# Patient Record
Sex: Female | Born: 1960 | Race: White | Hispanic: No | Marital: Single | Smoking: Never smoker
Health system: Southern US, Community
[De-identification: ages and names within clinical notes are randomized; demographics above are authoritative.]

## PROBLEM LIST (undated history)

## (undated) DIAGNOSIS — F419 Anxiety disorder, unspecified: Secondary | ICD-10-CM

## (undated) DIAGNOSIS — F32A Depression, unspecified: Secondary | ICD-10-CM

## (undated) DIAGNOSIS — G43909 Migraine, unspecified, not intractable, without status migrainosus: Secondary | ICD-10-CM

## (undated) DIAGNOSIS — F329 Major depressive disorder, single episode, unspecified: Secondary | ICD-10-CM

## (undated) HISTORY — DX: Migraine, unspecified, not intractable, without status migrainosus: G43.909

## (undated) HISTORY — DX: Anxiety disorder, unspecified: F41.9

---

## 1983-07-22 HISTORY — PX: OTHER SURGICAL HISTORY: SHX169

## 1998-01-20 ENCOUNTER — Inpatient Hospital Stay (HOSPITAL_COMMUNITY): Admission: AD | Admit: 1998-01-20 | Discharge: 1998-01-20 | Payer: Self-pay | Admitting: *Deleted

## 1998-01-23 ENCOUNTER — Ambulatory Visit (HOSPITAL_COMMUNITY): Admission: RE | Admit: 1998-01-23 | Discharge: 1998-01-23 | Payer: Self-pay | Admitting: *Deleted

## 1998-01-25 ENCOUNTER — Ambulatory Visit (HOSPITAL_COMMUNITY): Admission: RE | Admit: 1998-01-25 | Discharge: 1998-01-25 | Payer: Self-pay | Admitting: *Deleted

## 2008-05-26 ENCOUNTER — Ambulatory Visit: Payer: Self-pay | Admitting: Internal Medicine

## 2008-06-20 ENCOUNTER — Ambulatory Visit: Payer: Self-pay | Admitting: Internal Medicine

## 2009-03-10 ENCOUNTER — Emergency Department: Payer: Self-pay | Admitting: Unknown Physician Specialty

## 2009-03-12 ENCOUNTER — Emergency Department: Payer: Self-pay | Admitting: Emergency Medicine

## 2010-12-11 ENCOUNTER — Ambulatory Visit: Payer: Self-pay

## 2012-01-06 ENCOUNTER — Ambulatory Visit: Payer: Self-pay

## 2013-02-09 ENCOUNTER — Ambulatory Visit: Payer: Self-pay

## 2013-02-24 ENCOUNTER — Ambulatory Visit: Payer: Self-pay | Admitting: Internal Medicine

## 2013-03-08 ENCOUNTER — Ambulatory Visit: Payer: Self-pay

## 2014-12-21 IMAGING — CR DG LUMBAR SPINE COMPLETE 4+V
1 series · 5 of 5 positions shown · non-contrast
Comparison: none

REASON FOR EXAM: low back pain
COMMENTS:

PROCEDURE:     KDR - KDXR LUMBAR SPINE WITH OBLIQUES  - February 09, 2013  [DATE]
RESULT:     Comparison: None

[Series 1: ap · 0.17mm/px · 5 of 5 slices shown]
[im 1/5]
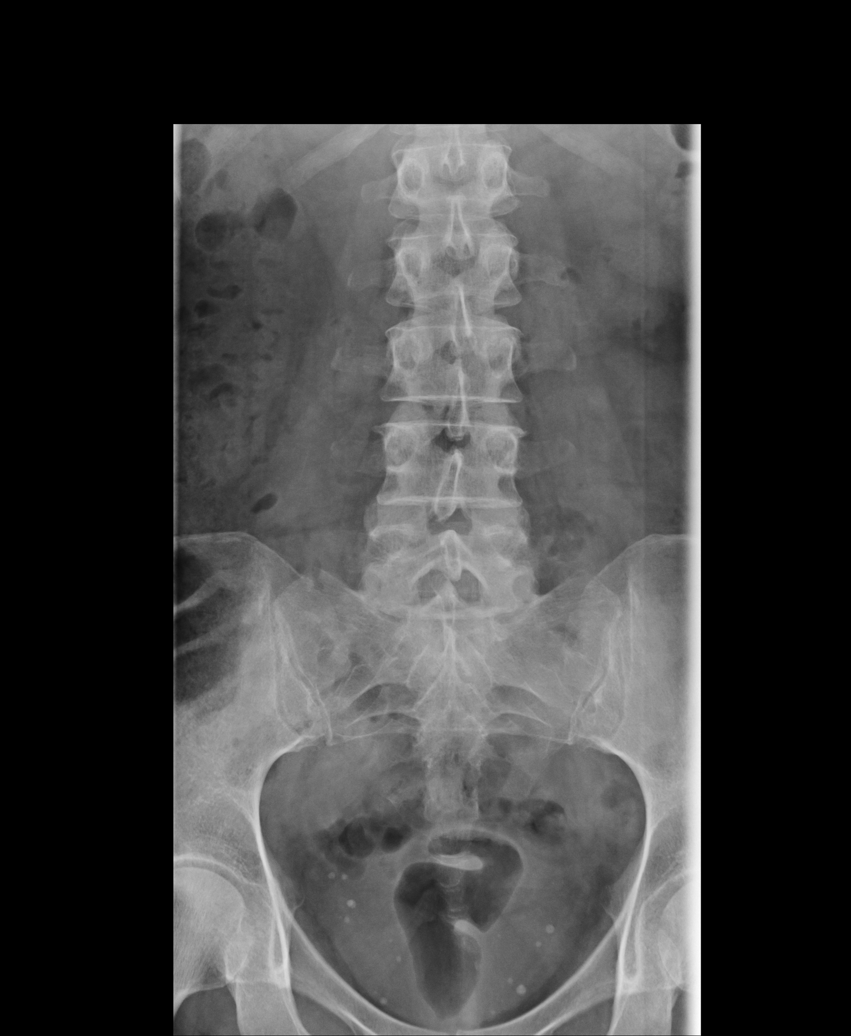
[im 2/5]
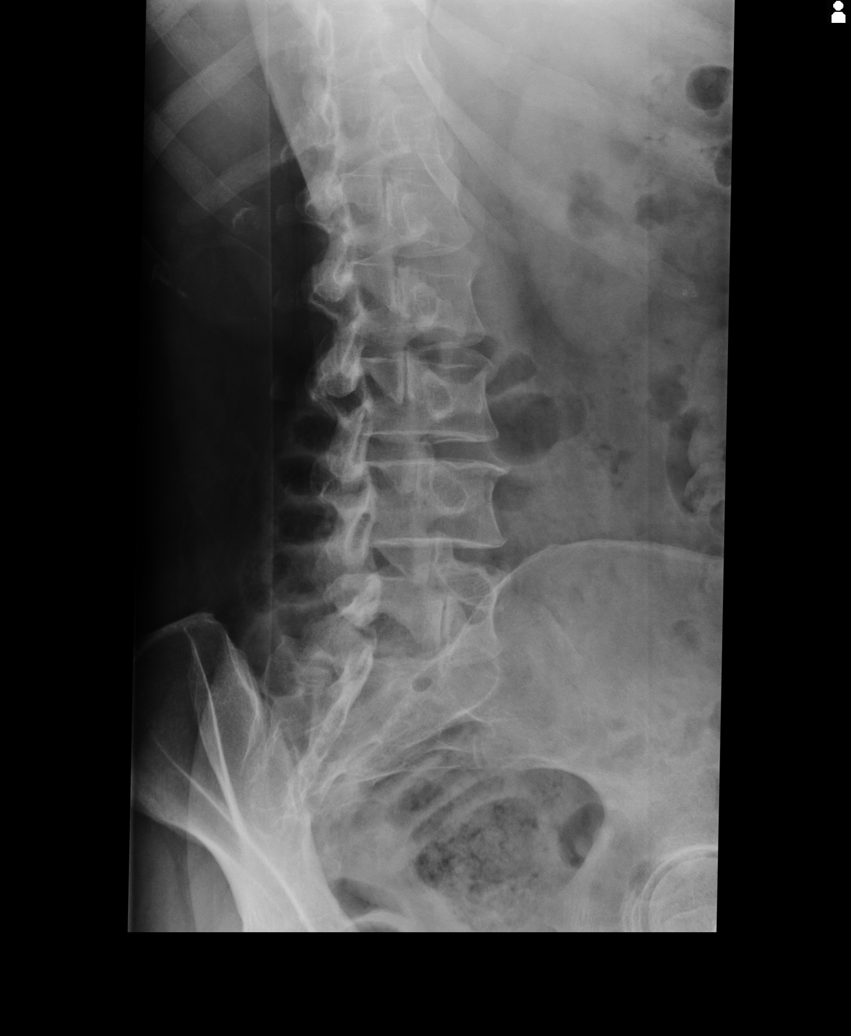
[im 3/5]
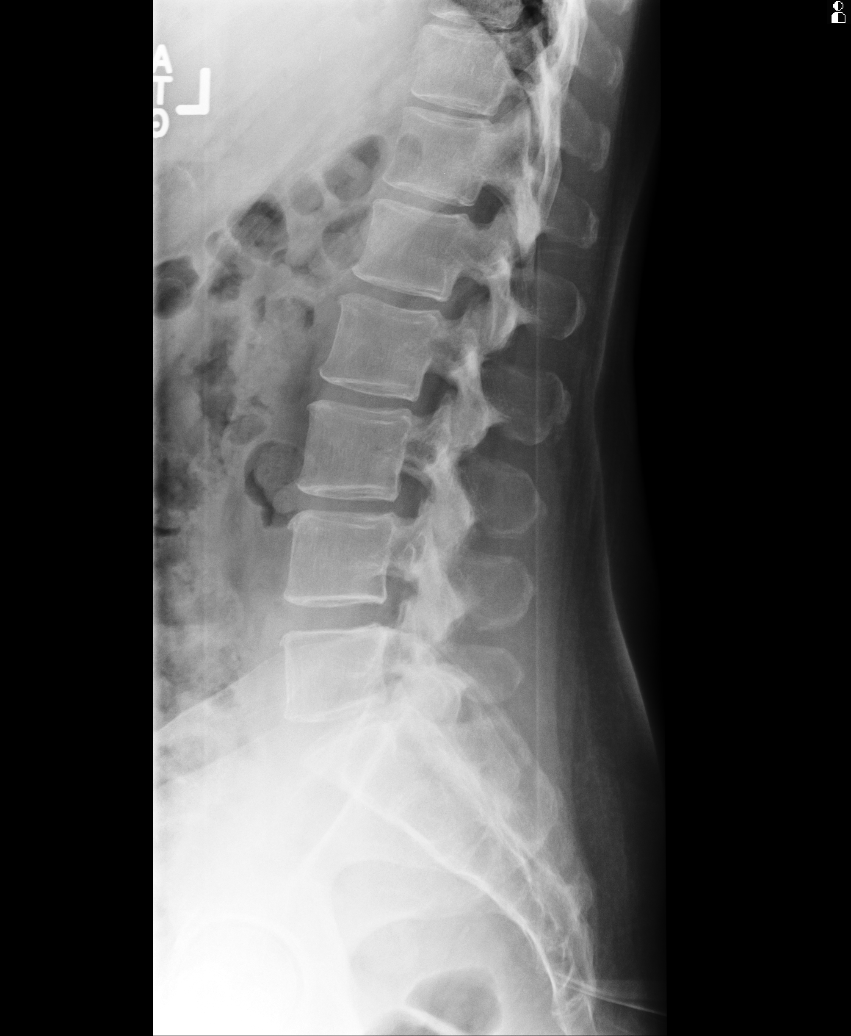
[im 4/5]
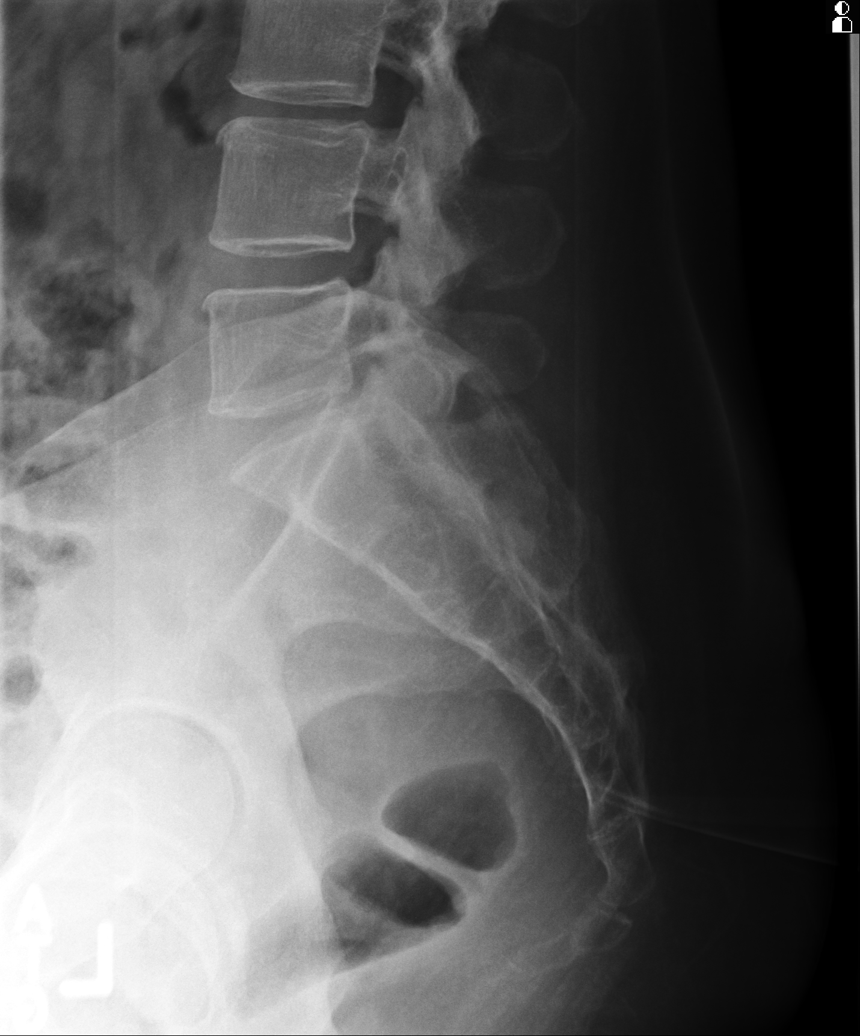
[im 5/5]
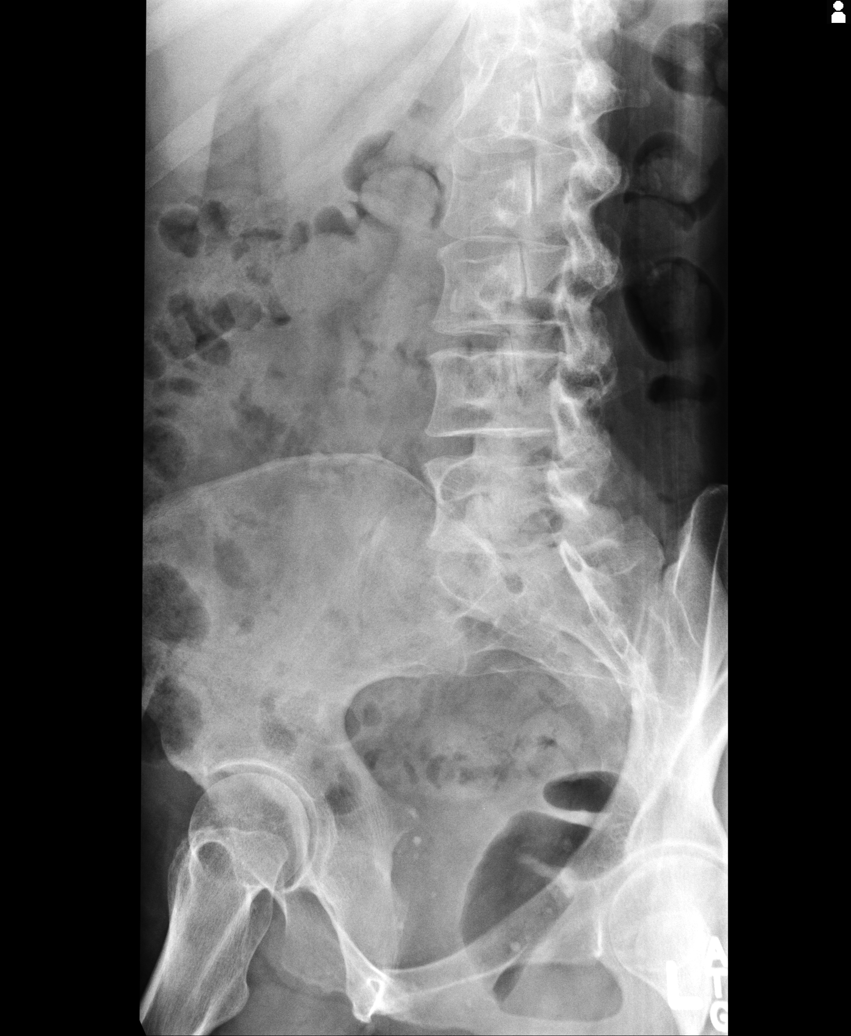

[5 of 5 positions shown; findings below may reference images not displayed]

FINDINGS: AP, lateral, and bilateral obliques views of the lumbar spine and a coned
down view of the lumbosacral junction are provided.

There are 5 nonrib bearing lumbar-type vertebral bodies. The vertebral body
heights are maintained. The alignment is anatomic. There is no
spondylolysis. There is no acute fracture or static listhesis. The disc
spaces are maintained.

The SI joints are unremarkable.
IMPRESSION: 1. No acute osseous abnormality of the lumbar spine.

[REDACTED]

## 2015-11-24 ENCOUNTER — Emergency Department
Admission: EM | Admit: 2015-11-24 | Discharge: 2015-11-24 | Disposition: A | Discharge status: Home / Self Care | Payer: 59 | Attending: Student | Admitting: Student

## 2015-11-24 ENCOUNTER — Emergency Department: Payer: 59

## 2015-11-24 DIAGNOSIS — M549 Dorsalgia, unspecified: Secondary | ICD-10-CM | POA: Diagnosis present

## 2015-11-24 DIAGNOSIS — F329 Major depressive disorder, single episode, unspecified: Secondary | ICD-10-CM | POA: Diagnosis not present

## 2015-11-24 DIAGNOSIS — M5431 Sciatica, right side: Secondary | ICD-10-CM | POA: Insufficient documentation

## 2015-11-24 HISTORY — DX: Major depressive disorder, single episode, unspecified: F32.9

## 2015-11-24 HISTORY — DX: Depression, unspecified: F32.A

## 2015-11-24 LAB — CBC WITH DIFFERENTIAL/PLATELET
Basophils Absolute: 0 10*3/uL (ref 0–0.1)
Basophils Relative: 0 %
EOS ABS: 0 10*3/uL (ref 0–0.7)
Eosinophils Relative: 0 %
HCT: 40.4 % (ref 35.0–47.0)
HEMOGLOBIN: 13.5 g/dL (ref 12.0–16.0)
LYMPHS ABS: 2.3 10*3/uL (ref 1.0–3.6)
MCH: 30.7 pg (ref 26.0–34.0)
MCHC: 33.3 g/dL (ref 32.0–36.0)
MCV: 92 fL (ref 80.0–100.0)
Monocytes Absolute: 0.9 10*3/uL (ref 0.2–0.9)
Neutro Abs: 10.9 10*3/uL — ABNORMAL HIGH (ref 1.4–6.5)
Platelets: 275 10*3/uL (ref 150–440)
RBC: 4.39 MIL/uL (ref 3.80–5.20)
RDW: 13.4 % (ref 11.5–14.5)
WBC: 14.2 10*3/uL — AB (ref 3.6–11.0)

## 2015-11-24 LAB — URINALYSIS COMPLETE WITH MICROSCOPIC (ARMC ONLY)
BACTERIA UA: NONE SEEN
BILIRUBIN URINE: NEGATIVE
GLUCOSE, UA: NEGATIVE mg/dL
Hgb urine dipstick: NEGATIVE
LEUKOCYTES UA: NEGATIVE
NITRITE: NEGATIVE
PROTEIN: 30 mg/dL — AB
SPECIFIC GRAVITY, URINE: 1.031 — AB (ref 1.005–1.030)
pH: 5 (ref 5.0–8.0)

## 2015-11-24 LAB — COMPREHENSIVE METABOLIC PANEL
ALK PHOS: 55 U/L (ref 38–126)
ALT: 21 U/L (ref 14–54)
ANION GAP: 10 (ref 5–15)
AST: 22 U/L (ref 15–41)
Albumin: 4.7 g/dL (ref 3.5–5.0)
BILIRUBIN TOTAL: 0.8 mg/dL (ref 0.3–1.2)
BUN: 30 mg/dL — ABNORMAL HIGH (ref 6–20)
CALCIUM: 9.6 mg/dL (ref 8.9–10.3)
CO2: 24 mmol/L (ref 22–32)
CREATININE: 0.78 mg/dL (ref 0.44–1.00)
Chloride: 106 mmol/L (ref 101–111)
Glucose, Bld: 98 mg/dL (ref 65–99)
Potassium: 3.4 mmol/L — ABNORMAL LOW (ref 3.5–5.1)
SODIUM: 140 mmol/L (ref 135–145)
TOTAL PROTEIN: 7.5 g/dL (ref 6.5–8.1)

## 2015-11-24 MED ORDER — MORPHINE SULFATE (PF) 4 MG/ML IV SOLN
4.0000 mg | Freq: Once | INTRAVENOUS | Status: AC
  Administered 2015-11-24: 4 mg via INTRAVENOUS
  Filled 2015-11-24: qty 1

## 2015-11-24 MED ORDER — KETOROLAC TROMETHAMINE 30 MG/ML IJ SOLN
15.0000 mg | Freq: Once | INTRAMUSCULAR | Status: AC
  Administered 2015-11-24: 15 mg via INTRAVENOUS
  Filled 2015-11-24: qty 1

## 2015-11-24 MED ORDER — OXYCODONE HCL 5 MG PO TABS: 5.0000 mg | ORAL_TABLET | Freq: Four times a day (QID) | ORAL | Status: DC | PRN

## 2015-11-24 MED ORDER — DIAZEPAM 5 MG/ML IJ SOLN
2.5000 mg | Freq: Once | INTRAMUSCULAR | Status: AC
Start: 2015-11-24 — End: 2015-11-24
  Administered 2015-11-24: 2.5 mg via INTRAVENOUS
  Filled 2015-11-24: qty 2

## 2015-11-24 MED ORDER — OXYCODONE HCL 5 MG PO TABS
10.0000 mg | ORAL_TABLET | Freq: Once | ORAL | Status: AC
Start: 2015-11-24 — End: 2015-11-24
  Administered 2015-11-24: 10 mg via ORAL
  Filled 2015-11-24: qty 2

## 2015-11-24 MED ORDER — SODIUM CHLORIDE 0.9 % IV BOLUS (SEPSIS)
500.0000 mL | Freq: Once | INTRAVENOUS | Status: AC
  Administered 2015-11-24: 500 mL via INTRAVENOUS

## 2015-11-24 NOTE — ED Notes (Signed)
Pt presents via EMS c/o right sides back pain into right leg x1 week. Dx by PCP with sciatica and started on prednisone and "muscle relaxer" per report. No resolution of symptoms with acute worsening. 50 mcg fentanyl given by EMS in route.

## 2015-11-24 NOTE — ED Provider Notes (Signed)
[facility] Emergency Department Provider Note   ____________________________________________  Time seen: Approximately 10:01 AM  I have reviewed the triage vital signs and the nursing notes.   HISTORY  Chief Complaint Back Pain    HPI Melinda Palmer is a 55 y.o. female history of depression, history of "bulging disks in my back" who presents for evaluation of one week of worsening right pain radiating into the right posterior thigh, gradual onset, constant since onset, worse with movements, currently severe. Patient was seen by her primary care doctor 2 days ago, diagnosed with sciatica and started on a muscle relaxer as well as tramadol however, her symptoms have persisted and worsened today. No history of malignancy, no history of IV drug use, no fevers, no bowel or bladder incontinence or saddle anesthesia, no numbness or weakness in the legs. No chest pain, shortness of breath or abdominal pain, no pain or burning with urination. She has never had pain like this before. No recent falls or trauma.   Past Medical History  Diagnosis Date  . Depression     There are no active problems to display for this patient.   No past surgical history on file.  No current outpatient prescriptions on file.  Allergies Penicillins  History reviewed. No pertinent family history.  Social History Social History  Substance Use Topics  . Smoking status: Never Smoker   . Smokeless tobacco: None  . Alcohol Use: No    Review of Systems Constitutional: No fever/chills Eyes: No visual changes. ENT: No sore throat. Cardiovascular: Denies chest pain. Respiratory: Denies shortness of breath. Gastrointestinal: No abdominal pain.  No nausea, no vomiting.  No diarrhea.  No constipation. Genitourinary: Negative for dysuria. Musculoskeletal: Positive for back pain. Skin: Negative for rash. Neurological: Negative for headaches, focal weakness or numbness.  10-point  ROS otherwise negative.  ____________________________________________   PHYSICAL EXAM:  VITAL SIGNS: ED Triage Vitals  Enc Vitals Group     BP 11/24/15 0959 133/88 mmHg     Pulse Rate 11/24/15 0959 88     Resp 11/24/15 0959 14     Temp 11/24/15 0959 98.1 F (36.7 C)     Temp Source 11/24/15 0959 Oral     SpO2 11/24/15 0959 99 %     Weight 11/24/15 0959 164 lb (74.39 kg)     Height 11/24/15 0959 5\' 5"  (1.651 m)     Head Cir --      Peak Flow --      Pain Score 11/24/15 1000 8     Pain Loc --      Pain Edu? --      Excl. in [place]? --     Constitutional: Alert and oriented. In distress secondary to pain. Eyes: Conjunctivae are normal. PERRL. EOMI. Head: Atraumatic. Nose: No congestion/rhinnorhea. Mouth/Throat: Mucous membranes are moist.  Oropharynx non-erythematous. Neck: No stridor. Supple without meningismus. Cardiovascular: Normal rate, regular rhythm. Grossly normal heart sounds.  Good peripheral circulation. Respiratory: Normal respiratory effort.  No retractions. Lungs CTAB. Gastrointestinal: Soft and nontender. No distention. No CVA tenderness. Genitourinary: Deferred Musculoskeletal: No lower extremity tenderness nor edema.  No joint effusions. Minimal Tenderness to palpation throughout the right lumbar paraspinal muscles. No midline T or L-spine tenderness to palpation. Positive straight leg raise in the right leg at 45. Neurologic:  Normal speech and language. No gross focal neurologic deficits are appreciated. Normal strength of dorsiflexion of the big toes bilaterally. Skin:  Skin is warm, dry and intact.  No rash noted. Psychiatric: Mood and affect are normal. Speech and behavior are normal.  ____________________________________________   LABS (all labs ordered are listed, but only abnormal results are displayed)  Labs Reviewed  CBC WITH DIFFERENTIAL/PLATELET - Abnormal; Notable for the following:    WBC 14.2 (*)    Neutro Abs 10.9 (*)    All other  components within normal limits  COMPREHENSIVE METABOLIC PANEL - Abnormal; Notable for the following:    Potassium 3.4 (*)    BUN 30 (*)    All other components within normal limits  URINALYSIS COMPLETEWITH MICROSCOPIC ([facility] ONLY) - Abnormal; Notable for the following:    Color, Urine AMBER (*)    APPearance HAZY (*)    Ketones, ur TRACE (*)    Specific Gravity, Urine 1.031 (*)    Protein, ur 30 (*)    Squamous Epithelial / LPF 6-30 (*)    All other components within normal limits  URINE CULTURE   ____________________________________________  EKG  none ____________________________________________  RADIOLOGY  Lumbar xray  IMPRESSION: 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 Other disc spaces appear unremarkable. No fracture or spondylolisthesis.  ____________________________________________   PROCEDURES  Procedure(s) performed: None  Critical Care performed: No  ____________________________________________   INITIAL IMPRESSION / ASSESSMENT AND PLAN / ED COURSE  Pertinent labs & imaging results that were available during my care of the patient were reviewed by me and considered in my medical decision making (see chart for details).  Melinda Palmer is a 55 y.o. female history of depression, history of "bulging disks in my back" who presents for evaluation of one week of worsening right pain radiating into the right posterior thigh. On exam, she is in severe distress due to pain. Vital signs are stable, she is afebrile. She has an intact neurological examination, no saddle anesthesia, no red flags concerning for cauda equina or epidural abscess. Suspect that this may represent ongoing sciatica, we'll treat her symptomatically and reassess for disposition.  ----------------------------------------- 10:55 AM on 11/24/2015 ----------------------------------------- Patient still intermittent crying out in pain, we'll give morphine. We'll obtain  screening labs, number plain films, as well as urinalysis to evaluate for other etiologies which could cause her pain.  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 CBC is notable for leukocytosis, white blood cell count is elevated at 14,000 however this is likely catecholamine/stress-induced in the setting of pain. CMP with mild nonspecific BUN elevation. Urinalysis with minimal red blood cells, doubt kidney stone. There are a few white blood cells however there are also multiple squamous cells and I suspect this is a contaminated specimen, will send urine culture. There are no bacteria. Patient reports that she feels much better. She is lying comfortably in bed, she is ambulating without any assistance. We discussed return precautions, need for close PCP follow-up and she is comfortable with the discharge plan. DC home.  ____________________________________________   FINAL CLINICAL IMPRESSION(S) / ED DIAGNOSES  Final diagnoses:  Sciatica of right side      NEW MEDICATIONS STARTED DURING THIS VISIT:  New Prescriptions   No medications on file     Note:  This document was prepared using Dragon voice recognition software and may include unintentional dictation errors.    Joanne Gavel, MD 11/24/15 1324

## 2015-11-24 NOTE — Discharge Instructions (Signed)
Stop taking tramadol. Start taking Roxicodone as prescribed for pain.

## 2015-11-26 LAB — URINE CULTURE

## 2016-08-04 DIAGNOSIS — K219 Gastro-esophageal reflux disease without esophagitis: Secondary | ICD-10-CM | POA: Diagnosis not present

## 2016-10-28 DIAGNOSIS — N390 Urinary tract infection, site not specified: Secondary | ICD-10-CM | POA: Diagnosis not present

## 2017-02-28 DIAGNOSIS — N390 Urinary tract infection, site not specified: Secondary | ICD-10-CM | POA: Diagnosis not present

## 2017-05-19 DIAGNOSIS — Z0001 Encounter for general adult medical examination with abnormal findings: Secondary | ICD-10-CM | POA: Diagnosis not present

## 2017-06-16 DIAGNOSIS — Z1231 Encounter for screening mammogram for malignant neoplasm of breast: Secondary | ICD-10-CM | POA: Diagnosis not present

## 2017-10-04 IMAGING — CR DG LUMBAR SPINE 2-3V
3 series · 3 of 3 positions shown · non-contrast
Comparison: Lumbar spine radiographs February 09, 2013; lumbar MRI
February 24, 2013

CLINICAL DATA: Lumbago with right-sided radicular symptoms for 1
week

EXAM:
LUMBAR SPINE - 2-3 VIEW

[l-spine ap]
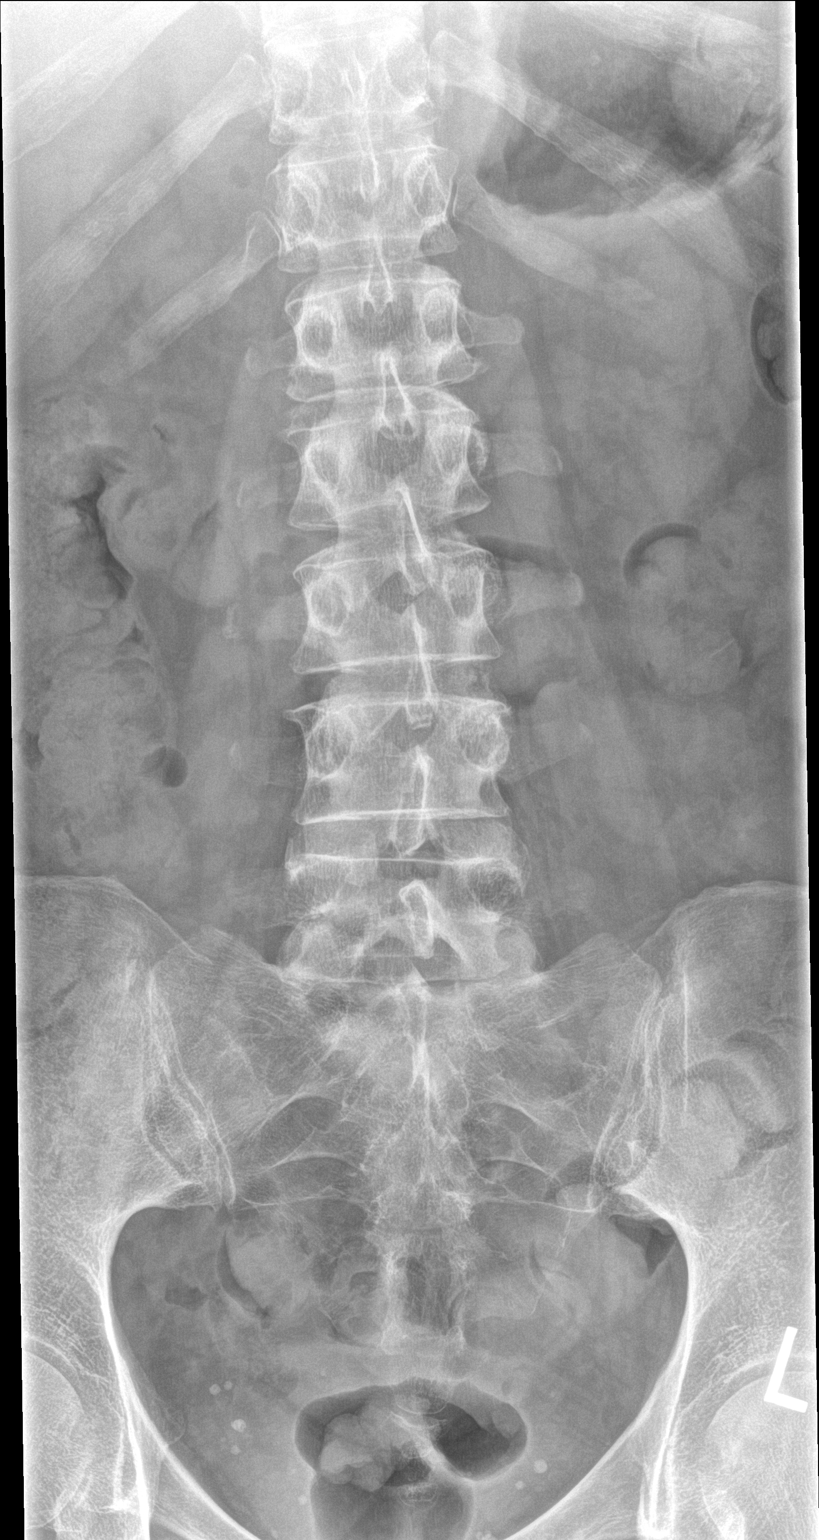

[l-spine lat]
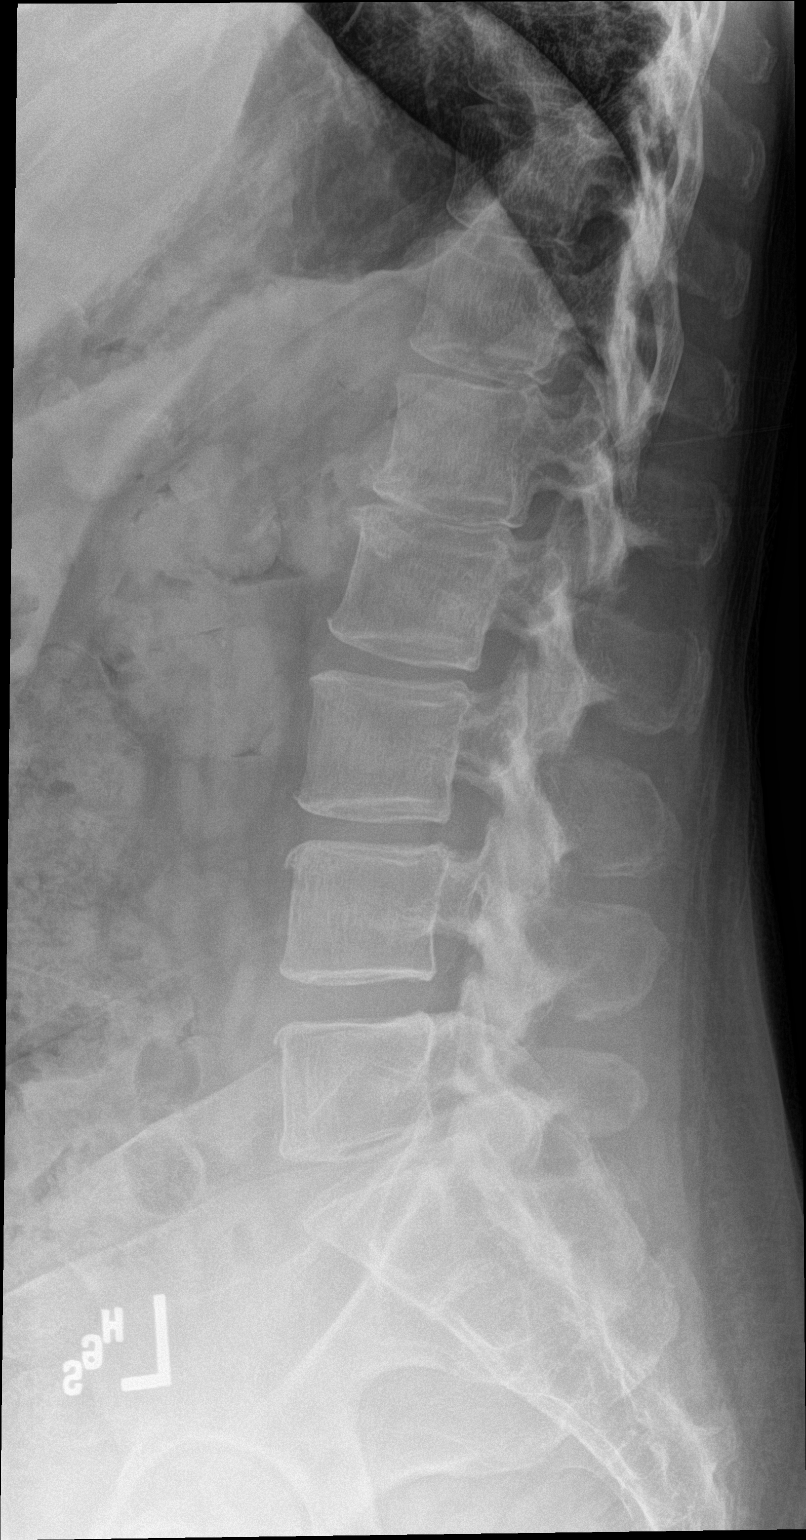

[l-spine spot]
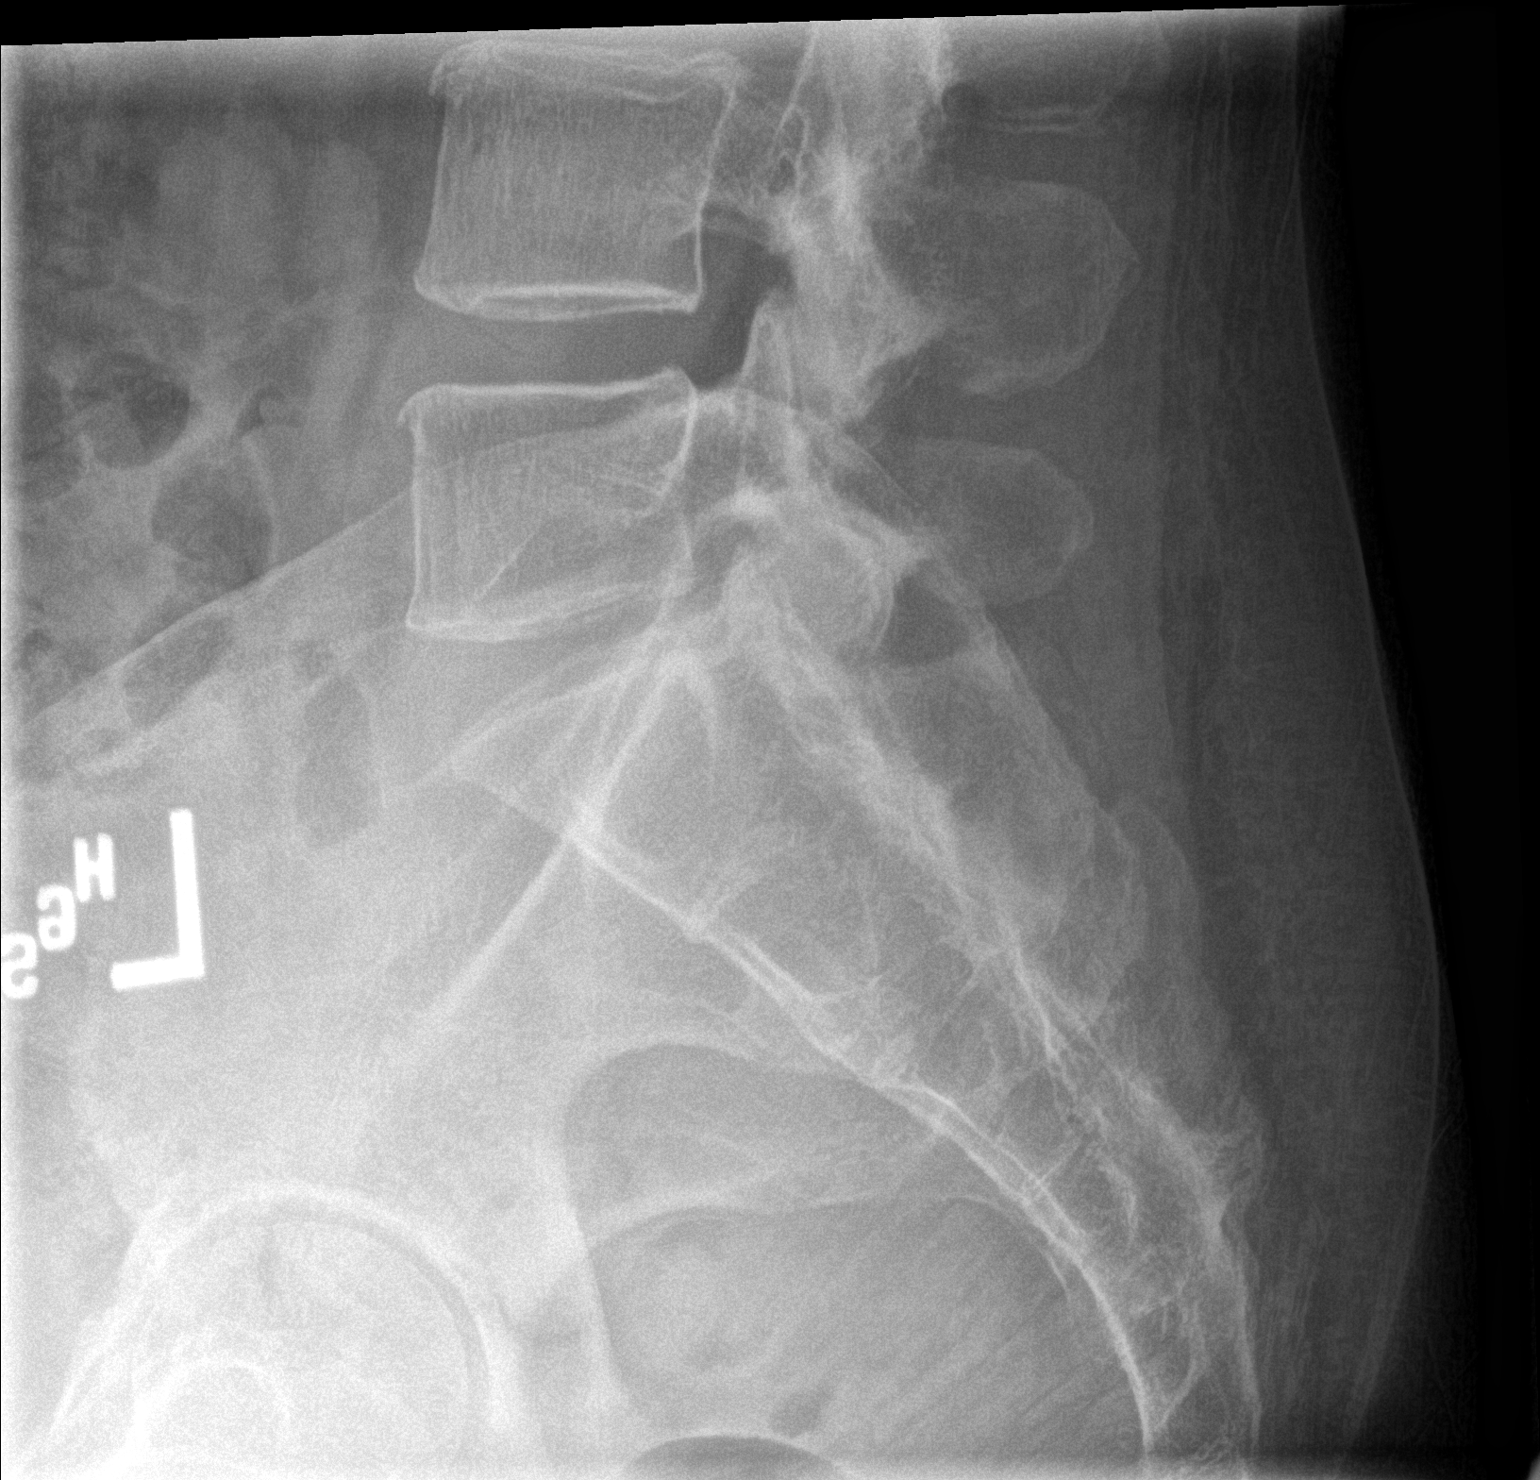

[3 of 3 positions shown; findings below may reference images not displayed]

FINDINGS: Frontal, lateral, and spot lumbosacral lateral images were obtained.
There are 5 non-rib-bearing lumbar type vertebral bodies. There is
no fracture or spondylolisthesis. There is moderate disc space
narrowing at L1-2. Other disc spaces appear unremarkable. No erosive
change.
IMPRESSION: Osteoarthritic change at L1-2, progressed from prior studies. Other
disc spaces appear unremarkable. No fracture or spondylolisthesis.

## 2017-10-20 DIAGNOSIS — N390 Urinary tract infection, site not specified: Secondary | ICD-10-CM | POA: Diagnosis not present

## 2017-12-07 ENCOUNTER — Other Ambulatory Visit: Payer: Self-pay

## 2017-12-07 MED ORDER — SERTRALINE HCL 50 MG PO TABS: 50.0000 mg | ORAL_TABLET | Freq: Every day | ORAL | 1 refills | Status: DC

## 2018-04-22 ENCOUNTER — Telehealth: Payer: Self-pay

## 2018-04-22 ENCOUNTER — Other Ambulatory Visit: Payer: Self-pay

## 2018-04-22 NOTE — Telephone Encounter (Signed)
lmom we send med for 90 with no refills keep appt coming up next month

## 2018-04-23 ENCOUNTER — Other Ambulatory Visit: Payer: Self-pay

## 2018-04-23 MED ORDER — SERTRALINE HCL 50 MG PO TABS: 50.0000 mg | ORAL_TABLET | Freq: Every day | ORAL | 0 refills | Status: DC

## 2018-05-24 ENCOUNTER — Encounter: Payer: Self-pay | Admitting: Adult Health

## 2018-06-21 ENCOUNTER — Other Ambulatory Visit: Payer: Self-pay

## 2018-06-21 MED ORDER — SERTRALINE HCL 50 MG PO TABS: 50.0000 mg | ORAL_TABLET | Freq: Every day | ORAL | 0 refills | Status: DC

## 2018-08-16 ENCOUNTER — Encounter: Payer: Self-pay | Admitting: Nurse Practitioner

## 2018-08-20 ENCOUNTER — Ambulatory Visit: Payer: Managed Care, Other (non HMO) | Admitting: Nurse Practitioner

## 2018-08-20 ENCOUNTER — Encounter: Payer: Self-pay | Admitting: Nurse Practitioner

## 2018-08-20 VITALS — BP 114/69 | HR 77 | Resp 16 | Ht 65.0 in | Wt 170.8 lb

## 2018-08-20 DIAGNOSIS — F329 Major depressive disorder, single episode, unspecified: Secondary | ICD-10-CM

## 2018-08-20 DIAGNOSIS — R5383 Other fatigue: Secondary | ICD-10-CM | POA: Diagnosis not present

## 2018-08-20 DIAGNOSIS — Z1239 Encounter for other screening for malignant neoplasm of breast: Secondary | ICD-10-CM

## 2018-08-20 DIAGNOSIS — R300 Dysuria: Secondary | ICD-10-CM

## 2018-08-20 DIAGNOSIS — F411 Generalized anxiety disorder: Secondary | ICD-10-CM | POA: Diagnosis not present

## 2018-08-20 DIAGNOSIS — Z0001 Encounter for general adult medical examination with abnormal findings: Secondary | ICD-10-CM | POA: Diagnosis not present

## 2018-08-20 DIAGNOSIS — E559 Vitamin D deficiency, unspecified: Secondary | ICD-10-CM

## 2018-08-20 DIAGNOSIS — Z1211 Encounter for screening for malignant neoplasm of colon: Secondary | ICD-10-CM

## 2018-08-20 MED ORDER — ALPRAZOLAM 0.5 MG PO TABS: 0.5000 mg | ORAL_TABLET | Freq: Every evening | ORAL | 3 refills | Status: DC | PRN

## 2018-08-20 MED ORDER — BUPROPION HCL 75 MG PO TABS: 75.0000 mg | ORAL_TABLET | Freq: Two times a day (BID) | ORAL | 3 refills | Status: DC

## 2018-08-20 NOTE — Progress Notes (Signed)
[facility] [place], [place] [address]  Internal MEDICINE  Office Visit Note  Patient Name: Melinda Palmer  [id number]  [id number]  Date of Service: 08/29/2018   Pt is here for routine health maintenance examination   Chief Complaint  Patient presents with  . Annual Exam     The patient is here for health maintenance exam. She has no physical concerns or complaints today. She admits to having some trouble with decreased libido over the past few months. She does take sertraline 50mg  daily to control anxiety and depression and uses alprazolam 0.5mg  when needed to improve acute anxiety and insomnia. She is unsure if the decreased libido has to to with medication she is currently taking or if it related to getting older.  She is due to have routine, fasting labs done as well as mammogram. She is also due to have screening colonoscopy.    Current Medication: Outpatient Encounter Medications as of 08/20/2018  Medication Sig  . ALPRAZolam (XANAX) 0.5 MG tablet Take 1 tablet (0.5 mg total) by mouth at bedtime as needed for anxiety.  Marland Kitchen oxyCODONE (ROXICODONE) 5 MG immediate release tablet Take 1 tablet (5 mg total) by mouth every 6 (six) hours as needed for moderate pain. Do not drive while taking this medication.  . sertraline (ZOLOFT) 50 MG tablet Take 1 tablet (50 mg total) by mouth daily.  . [DISCONTINUED] ALPRAZolam (XANAX) 0.5 MG tablet 1 tablet.  Marland Kitchen buPROPion (WELLBUTRIN) 75 MG tablet Take 1 tablet (75 mg total) by mouth 2 (two) times daily.   No facility-administered encounter medications on file as of 08/20/2018.     Surgical History: Past Surgical History:  Procedure Laterality Date  . child birth natural  50    Medical History: Past Medical History:  Diagnosis Date  . Anxiety   . Depression   . Migraines     Family History: Family History  Problem Relation Age of Onset  . Hypertension Neg Hx   . Diabetes Neg Hx   . Hyperlipidemia Neg Hx    . Lymphoma Neg Hx   . Migraines Neg Hx   . Glaucoma Neg Hx   . Stroke Neg Hx       Review of Systems  Constitutional: Negative for chills, fatigue and unexpected weight change.  HENT: Negative for congestion, postnasal drip, rhinorrhea, sneezing and sore throat.   Respiratory: Negative for cough, chest tightness and shortness of breath.   Cardiovascular: Negative for chest pain and palpitations.  Gastrointestinal: Negative for abdominal pain, constipation, diarrhea, nausea and vomiting.  Endocrine: Negative for heat intolerance, polydipsia and polyuria.  Genitourinary: Negative for dysuria and frequency.  Musculoskeletal: Negative for arthralgias, back pain, joint swelling and neck pain.  Skin: Negative for rash.  Allergic/Immunologic: Negative for environmental allergies.  Neurological: Negative for dizziness, tremors, numbness and headaches.  Hematological: Negative for adenopathy. Does not bruise/bleed easily.  Psychiatric/Behavioral: Negative for behavioral problems (Depression), sleep disturbance and suicidal ideas. The patient is nervous/anxious.      Vital Signs: BP 114/69 (BP Location: Right Arm, Patient Position: Sitting, Cuff Size: Large)   Pulse 77   Resp 16   Ht 5\' 5"  (1.651 m)   Wt 170 lb 12.8 oz (77.5 kg)   LMP  (LMP Unknown)   SpO2 95%   BMI 28.42 kg/m    Physical Exam Vitals signs and nursing note reviewed.  Constitutional:      General: She is not in acute distress.  Appearance: Normal appearance. She is well-developed. She is not diaphoretic.  HENT:     Head: Normocephalic and atraumatic.     Mouth/Throat:     Pharynx: No oropharyngeal exudate.  Eyes:     Conjunctiva/sclera: Conjunctivae normal.     Pupils: Pupils are equal, round, and reactive to light.  Neck:     Musculoskeletal: Normal range of motion and neck supple.     Thyroid: No thyromegaly.     Vascular: No carotid bruit or JVD.     Trachea: No tracheal deviation.  Cardiovascular:      Rate and Rhythm: Normal rate and regular rhythm.     Pulses: Normal pulses.     Heart sounds: Normal heart sounds. No murmur. No friction rub. No gallop.   Pulmonary:     Effort: Pulmonary effort is normal. No respiratory distress.     Breath sounds: Normal breath sounds. No wheezing or rales.  Chest:     Chest wall: No tenderness.     Breasts:        Right: Normal. No swelling, bleeding, inverted nipple, mass, nipple discharge, skin change or tenderness.        Left: Normal. No swelling, bleeding, inverted nipple, mass, nipple discharge, skin change or tenderness.  Abdominal:     General: Bowel sounds are normal.     Palpations: Abdomen is soft.  Musculoskeletal: Normal range of motion.  Lymphadenopathy:     Cervical: No cervical adenopathy.  Skin:    General: Skin is warm and dry.     Capillary Refill: Capillary refill takes less than 2 seconds.  Neurological:     Mental Status: She is alert and oriented to person, place, and time.     Cranial Nerves: No cranial nerve deficit.  Psychiatric:        Behavior: Behavior normal.        Thought Content: Thought content normal.        Judgment: Judgment normal.      LABS: Recent Results (from the past 2160 hour(s))  UA/M w/rflx Culture, Routine     Status: None   Collection Time: 08/20/18 12:00 AM  Result Value Ref Range   Specific Gravity, UA 1.021 1.005 - 1.030   pH, UA 6.0 5.0 - 7.5   Color, UA Yellow Yellow   Appearance Ur Clear Clear   Leukocytes, UA Negative Negative   Protein, UA Negative Negative/Trace   Glucose, UA Negative Negative   Ketones, UA Negative Negative   RBC, UA Negative Negative   Bilirubin, UA Negative Negative   Urobilinogen, Ur 1.0 0.2 - 1.0 mg/dL   Nitrite, UA Negative Negative   Microscopic Examination Comment     Comment: Microscopic follows if indicated.   Microscopic Examination See below:     Comment: Microscopic was indicated and was performed.   Urinalysis Reflex Comment      Comment: This specimen will not reflex to a Urine Culture.  Microscopic Examination     Status: None   Collection Time: 08/20/18 12:00 AM  Result Value Ref Range   WBC, UA 0-5 0 - 5 /hpf   RBC, UA 0-2 0 - 2 /hpf   Epithelial Cells (non renal) None seen 0 - 10 /hpf   Casts None seen None seen /lpf   Mucus, UA Present Not Estab.   Bacteria, UA None seen None seen/Few  Comprehensive metabolic panel     Status: None   Collection Time: 08/23/18  8:55 AM  Result Value  Ref Range   Glucose 93 65 - 99 mg/dL   BUN 16 6 - 24 mg/dL   Creatinine, Ser 0.74 0.57 - 1.00 mg/dL   GFR calc non Af Amer 90 >59 mL/min/1.73   GFR calc Af Amer 104 >59 mL/min/1.73   BUN/Creatinine Ratio 22 9 - 23   Sodium 140 134 - 144 mmol/L   Potassium 4.6 3.5 - 5.2 mmol/L   Chloride 103 96 - 106 mmol/L   CO2 20 20 - 29 mmol/L   Calcium 9.7 8.7 - 10.2 mg/dL   Total Protein 6.9 6.0 - 8.5 g/dL   Albumin 4.6 3.8 - 4.9 g/dL    Comment:               **Please note reference interval change**   Globulin, Total 2.3 1.5 - 4.5 g/dL   Albumin/Globulin Ratio 2.0 1.2 - 2.2   Bilirubin Total 0.2 0.0 - 1.2 mg/dL   Alkaline Phosphatase 66 39 - 117 IU/L   AST 14 0 - 40 IU/L   ALT 18 0 - 32 IU/L  CBC     Status: None   Collection Time: 08/23/18  8:55 AM  Result Value Ref Range   WBC 5.7 3.4 - 10.8 x10E3/uL   RBC 4.33 3.77 - 5.28 x10E6/uL   Hemoglobin 13.7 11.1 - 15.9 g/dL   Hematocrit 39.4 34.0 - 46.6 %   MCV 91 79 - 97 fL   MCH 31.6 26.6 - 33.0 pg   MCHC 34.8 31.5 - 35.7 g/dL   RDW 12.8 11.7 - 15.4 %   Platelets 277 150 - 450 x10E3/uL  Lipid Panel w/o Chol/HDL Ratio     Status: Abnormal   Collection Time: 08/23/18  8:55 AM  Result Value Ref Range   Cholesterol, Total 188 100 - 199 mg/dL   Triglycerides 56 0 - 149 mg/dL   HDL 68 >39 mg/dL   VLDL Cholesterol Cal 11 5 - 40 mg/dL   LDL Calculated 109 (H) 0 - 99 mg/dL  T4, free     Status: None   Collection Time: 08/23/18  8:55 AM  Result Value Ref Range   Free T4 1.18  0.82 - 1.77 ng/dL  TSH     Status: None   Collection Time: 08/23/18  8:55 AM  Result Value Ref Range   TSH 3.460 0.450 - 4.500 uIU/mL  VITAMIN D 25 Hydroxy (Vit-D Deficiency, Fractures)     Status: Abnormal   Collection Time: 08/23/18  8:55 AM  Result Value Ref Range   Vit D, 25-Hydroxy 28.4 (L) 30.0 - 100.0 ng/mL    Comment: Vitamin D deficiency has been defined by the Institute of Medicine and an Endocrine Society practice guideline as a level of serum 25-OH vitamin D less than 20 ng/mL (1,2). The Endocrine Society went on to further define vitamin D insufficiency as a level between 21 and 29 ng/mL (2). 1. IOM (Institute of Medicine). 2010. Dietary reference    intakes for calcium and D. [place]: The    [employer]. 2. Holick MF, Binkley [place], Bischoff-Ferrari HA, et al.    Evaluation, treatment, and prevention of vitamin D    deficiency: an Endocrine Society clinical practice    guideline. JCEM. 2011 Jul; 96(7):1911-30.   T3     Status: None   Collection Time: 08/23/18  8:55 AM  Result Value Ref Range   T3, Total 92 71 - 180 ng/dL   Assessment/Plan:  1. Encounter for general adult  medical examination with abnormal findings Annual health maintenance exam today. Routine, fasting labs ordered.  - CBC with Differential/Platelet - Lipid panel  2. Other fatigue Will check labs and discuss with patient when available.  - Comprehensive metabolic panel - T4, free - TSH  3. Major depression, chronic Continue sertraline 50mg  daily. Add bupropion 75mg  which may be taken up to twice daily to improve depression and anxieety. May improve libido.  - buPROPion (WELLBUTRIN) 75 MG tablet; Take 1 tablet (75 mg total) by mouth 2 (two) times daily.  Dispense: 60 tablet; Refill: 3  4. Generalized anxiety disorder May take alprazolam 0.5mg  at bedtime as needed for acute anxiety/insomnia. New prescriptions provided today.  - ALPRAZolam (XANAX) 0.5 MG tablet; Take 1 tablet  (0.5 mg total) by mouth at bedtime as needed for anxiety.  Dispense: 30 tablet; Refill: 3  5. Vitamin D deficiency Check vitamin d level. - Vitamin D 1,25 dihydroxy  6. Screening for colon cancer - Ambulatory referral to Gastroenterology  7. Screening for breast cancer - MM DIGITAL SCREENING BILATERAL; Future  8. Dysuria - UA/M w/rflx Culture, Routine  General Counseling: Sandy Salaam understanding of the findings of todays visit and agrees with plan of treatment. I have discussed any further diagnostic evaluation that may be needed or ordered today. We also reviewed her medications today. she has been encouraged to call the office with any questions or concerns that should arise related to todays visit.    Counseling:  This patient was seen by Leretha Pol FNP Collaboration with Dr Lavera Guise as a part of collaborative care agreement  Orders Placed This Encounter  Procedures  . Microscopic Examination  . MM DIGITAL SCREENING BILATERAL  . UA/M w/rflx Culture, Routine  . CBC with Differential/Platelet  . Comprehensive metabolic panel  . T4, free  . TSH  . Lipid panel  . Vitamin D 1,25 dihydroxy  . Ambulatory referral to Gastroenterology    Meds ordered this encounter  Medications  . buPROPion (WELLBUTRIN) 75 MG tablet    Sig: Take 1 tablet (75 mg total) by mouth 2 (two) times daily.    Dispense:  60 tablet    Refill:  3    Order Specific Question:   Supervising Provider    Answer:   Lavera Guise [[id number]]  . ALPRAZolam (XANAX) 0.5 MG tablet    Sig: Take 1 tablet (0.5 mg total) by mouth at bedtime as needed for anxiety.    Dispense:  30 tablet    Refill:  3    Order Specific Question:   Supervising Provider    Answer:   Lavera Guise [[id number]]    Time spent: [place], MD  Internal Medicine

## 2018-08-21 LAB — UA/M W/RFLX CULTURE, ROUTINE
Bilirubin, UA: NEGATIVE
Glucose, UA: NEGATIVE
Ketones, UA: NEGATIVE
Leukocytes, UA: NEGATIVE
Nitrite, UA: NEGATIVE
PH UA: 6 (ref 5.0–7.5)
Protein, UA: NEGATIVE
RBC, UA: NEGATIVE
Specific Gravity, UA: 1.021 (ref 1.005–1.030)
Urobilinogen, Ur: 1 mg/dL (ref 0.2–1.0)

## 2018-08-21 LAB — MICROSCOPIC EXAMINATION
Bacteria, UA: NONE SEEN
CASTS: NONE SEEN /LPF
Epithelial Cells (non renal): NONE SEEN /hpf (ref 0–10)

## 2018-08-23 ENCOUNTER — Other Ambulatory Visit: Payer: Self-pay | Admitting: Nurse Practitioner

## 2018-08-23 ENCOUNTER — Other Ambulatory Visit: Payer: Self-pay

## 2018-08-23 DIAGNOSIS — Z1211 Encounter for screening for malignant neoplasm of colon: Secondary | ICD-10-CM

## 2018-08-24 LAB — T3: T3 TOTAL: 92 ng/dL (ref 71–180)

## 2018-08-24 LAB — COMPREHENSIVE METABOLIC PANEL
ALT: 18 IU/L (ref 0–32)
AST: 14 IU/L (ref 0–40)
Albumin/Globulin Ratio: 2 (ref 1.2–2.2)
Albumin: 4.6 g/dL (ref 3.8–4.9)
Alkaline Phosphatase: 66 IU/L (ref 39–117)
BILIRUBIN TOTAL: 0.2 mg/dL (ref 0.0–1.2)
BUN/Creatinine Ratio: 22 (ref 9–23)
BUN: 16 mg/dL (ref 6–24)
CHLORIDE: 103 mmol/L (ref 96–106)
CO2: 20 mmol/L (ref 20–29)
Calcium: 9.7 mg/dL (ref 8.7–10.2)
Creatinine, Ser: 0.74 mg/dL (ref 0.57–1.00)
GFR calc non Af Amer: 90 mL/min/{1.73_m2} (ref >59)
GFR, EST AFRICAN AMERICAN: 104 mL/min/{1.73_m2} (ref >59)
GLUCOSE: 93 mg/dL (ref 65–99)
Globulin, Total: 2.3 g/dL (ref 1.5–4.5)
Potassium: 4.6 mmol/L (ref 3.5–5.2)
Sodium: 140 mmol/L (ref 134–144)
TOTAL PROTEIN: 6.9 g/dL (ref 6.0–8.5)

## 2018-08-24 LAB — CBC
Hematocrit: 39.4 % (ref 34.0–46.6)
Hemoglobin: 13.7 g/dL (ref 11.1–15.9)
MCH: 31.6 pg (ref 26.6–33.0)
MCHC: 34.8 g/dL (ref 31.5–35.7)
MCV: 91 fL (ref 79–97)
Platelets: 277 10*3/uL (ref 150–450)
RBC: 4.33 x10E6/uL (ref 3.77–5.28)
RDW: 12.8 % (ref 11.7–15.4)
WBC: 5.7 10*3/uL (ref 3.4–10.8)

## 2018-08-24 LAB — LIPID PANEL W/O CHOL/HDL RATIO
Cholesterol, Total: 188 mg/dL (ref 100–199)
HDL: 68 mg/dL (ref >39)
LDL CALC: 109 mg/dL — AB (ref 0–99)
TRIGLYCERIDES: 56 mg/dL (ref 0–149)
VLDL Cholesterol Cal: 11 mg/dL (ref 5–40)

## 2018-08-24 LAB — VITAMIN D 25 HYDROXY (VIT D DEFICIENCY, FRACTURES): Vit D, 25-Hydroxy: 28.4 ng/mL — ABNORMAL LOW (ref 30.0–100.0)

## 2018-08-24 LAB — TSH: TSH: 3.46 u[IU]/mL (ref 0.450–4.500)

## 2018-08-24 LAB — T4, FREE: FREE T4: 1.18 ng/dL (ref 0.82–1.77)

## 2018-08-25 ENCOUNTER — Telehealth: Payer: Self-pay

## 2018-08-25 NOTE — Telephone Encounter (Signed)
Pt advised labs looked normal

## 2018-08-29 DIAGNOSIS — R300 Dysuria: Secondary | ICD-10-CM | POA: Insufficient documentation

## 2018-08-29 DIAGNOSIS — F329 Major depressive disorder, single episode, unspecified: Secondary | ICD-10-CM | POA: Insufficient documentation

## 2018-08-29 DIAGNOSIS — F411 Generalized anxiety disorder: Secondary | ICD-10-CM | POA: Insufficient documentation

## 2018-08-29 DIAGNOSIS — E559 Vitamin D deficiency, unspecified: Secondary | ICD-10-CM | POA: Insufficient documentation

## 2018-08-29 DIAGNOSIS — Z1211 Encounter for screening for malignant neoplasm of colon: Secondary | ICD-10-CM | POA: Insufficient documentation

## 2018-08-29 DIAGNOSIS — R5383 Other fatigue: Secondary | ICD-10-CM | POA: Insufficient documentation

## 2018-09-06 ENCOUNTER — Encounter: Payer: Self-pay | Admitting: *Deleted

## 2018-09-07 ENCOUNTER — Ambulatory Visit: Payer: Managed Care, Other (non HMO) | Admitting: Anesthesiology

## 2018-09-07 ENCOUNTER — Encounter: Payer: Self-pay | Admitting: Anesthesiology

## 2018-09-07 ENCOUNTER — Ambulatory Visit
Admission: RE | Admit: 2018-09-07 | Discharge: 2018-09-07 | Disposition: A | Discharge status: Home / Self Care | Payer: Managed Care, Other (non HMO) | Attending: Gastroenterology | Admitting: Gastroenterology

## 2018-09-07 ENCOUNTER — Encounter
Admission: RE | Disposition: A | Discharge status: Home / Self Care | Payer: Self-pay | Source: Home / Self Care | Attending: Gastroenterology

## 2018-09-07 DIAGNOSIS — D125 Benign neoplasm of sigmoid colon: Secondary | ICD-10-CM | POA: Insufficient documentation

## 2018-09-07 DIAGNOSIS — Z1211 Encounter for screening for malignant neoplasm of colon: Secondary | ICD-10-CM | POA: Diagnosis present

## 2018-09-07 DIAGNOSIS — G43909 Migraine, unspecified, not intractable, without status migrainosus: Secondary | ICD-10-CM | POA: Insufficient documentation

## 2018-09-07 DIAGNOSIS — D123 Benign neoplasm of transverse colon: Secondary | ICD-10-CM | POA: Insufficient documentation

## 2018-09-07 DIAGNOSIS — F419 Anxiety disorder, unspecified: Secondary | ICD-10-CM | POA: Insufficient documentation

## 2018-09-07 DIAGNOSIS — K635 Polyp of colon: Secondary | ICD-10-CM

## 2018-09-07 DIAGNOSIS — K641 Second degree hemorrhoids: Secondary | ICD-10-CM | POA: Insufficient documentation

## 2018-09-07 DIAGNOSIS — Z880 Allergy status to penicillin: Secondary | ICD-10-CM | POA: Insufficient documentation

## 2018-09-07 DIAGNOSIS — Z79899 Other long term (current) drug therapy: Secondary | ICD-10-CM | POA: Diagnosis not present

## 2018-09-07 DIAGNOSIS — F329 Major depressive disorder, single episode, unspecified: Secondary | ICD-10-CM | POA: Insufficient documentation

## 2018-09-07 HISTORY — PX: COLONOSCOPY WITH PROPOFOL: SHX5780

## 2018-09-07 SURGERY — COLONOSCOPY WITH PROPOFOL
Anesthesia: General

## 2018-09-07 MED ORDER — MIDAZOLAM HCL 2 MG/2ML IJ SOLN
INTRAMUSCULAR | Status: DC | PRN
  Administered 2018-09-07: 2 mg via INTRAVENOUS

## 2018-09-07 MED ORDER — MIDAZOLAM HCL 2 MG/2ML IJ SOLN
INTRAMUSCULAR | Status: AC
  Filled 2018-09-07: qty 2

## 2018-09-07 MED ORDER — PROPOFOL 500 MG/50ML IV EMUL
INTRAVENOUS | Status: AC
  Filled 2018-09-07: qty 50

## 2018-09-07 MED ORDER — PROPOFOL 10 MG/ML IV BOLUS
INTRAVENOUS | Status: DC | PRN
  Administered 2018-09-07: 80 mg via INTRAVENOUS
  Administered 2018-09-07 (×2): 19 mg via INTRAVENOUS

## 2018-09-07 MED ORDER — LIDOCAINE HCL (CARDIAC) PF 100 MG/5ML IV SOSY
PREFILLED_SYRINGE | INTRAVENOUS | Status: DC | PRN
  Administered 2018-09-07: 50 mg via INTRAVENOUS

## 2018-09-07 MED ORDER — SODIUM CHLORIDE 0.9 % IV SOLN
INTRAVENOUS | Status: DC
  Administered 2018-09-07: 08:00:00 via INTRAVENOUS

## 2018-09-07 MED ORDER — LIDOCAINE HCL (PF) 2 % IJ SOLN
INTRAMUSCULAR | Status: AC
  Filled 2018-09-07: qty 10

## 2018-09-07 MED ORDER — PROPOFOL 500 MG/50ML IV EMUL
INTRAVENOUS | Status: DC | PRN
  Administered 2018-09-07: 130 ug/kg/min via INTRAVENOUS

## 2018-09-07 NOTE — H&P (Signed)
Melinda Lame, MD [facility] [address], [place], [place] [address] Phone: [phone] Fax : [phone]  Primary Care Physician:  Ronnell Freshwater, NP Primary Gastroenterologist:  Dr. Allen Norris  Pre-Procedure History & Physical: HPI:  Melinda Palmer is a 58 y.o. female is here for a screening colonoscopy.   Past Medical History:  Diagnosis Date  . Anxiety   . Depression   . Migraines     Past Surgical History:  Procedure Laterality Date  . child birth natural  16    Prior to Admission medications   Medication Sig Start Date End Date Taking? Authorizing Provider  ALPRAZolam Duanne Moron) 0.5 MG tablet Take 1 tablet (0.5 mg total) by mouth at bedtime as needed for anxiety. 08/20/18  Yes Boscia, Greer Ee, NP  buPROPion (WELLBUTRIN) 75 MG tablet Take 1 tablet (75 mg total) by mouth 2 (two) times daily. 08/20/18  Yes Boscia, Greer Ee, NP  sertraline (ZOLOFT) 50 MG tablet Take 1 tablet (50 mg total) by mouth daily. 06/21/18  Yes Boscia, Greer Ee, NP  oxyCODONE (ROXICODONE) 5 MG immediate release tablet Take 1 tablet (5 mg total) by mouth every 6 (six) hours as needed for moderate pain. Do not drive while taking this medication. Patient not taking: Reported on 09/07/2018 11/24/15   Joanne Gavel, MD    Allergies as of 08/23/2018 - Review Complete 08/20/2018  Allergen Reaction Noted  . Penicillin g Rash and Swelling 02/07/2014  . Penicillins  11/24/2015    Family History  Problem Relation Age of Onset  . Hypertension Neg Hx   . Diabetes Neg Hx   . Hyperlipidemia Neg Hx   . Lymphoma Neg Hx   . Migraines Neg Hx   . Glaucoma Neg Hx   . Stroke Neg Hx     Social History   Socioeconomic History  . Marital status: Single    Spouse name: Not on file  . Number of children: Not on file  . Years of education: Not on file  . Highest education level: Not on file  Occupational History  . Not on file  Social Needs  . Financial resource strain: Not on file  . Food insecurity:   Worry: Not on file    Inability: Not on file  . Transportation needs:    Medical: Not on file    Non-medical: Not on file  Tobacco Use  . Smoking status: Never Smoker  . Smokeless tobacco: Never Used  Substance and Sexual Activity  . Alcohol use: No  . Drug use: No  . Sexual activity: Not on file  Lifestyle  . Physical activity:    Days per week: Not on file    Minutes per session: Not on file  . Stress: Not on file  Relationships  . Social connections:    Talks on phone: Not on file    Gets together: Not on file    Attends religious service: Not on file    Active member of club or organization: Not on file    Attends meetings of clubs or organizations: Not on file    Relationship status: Not on file  . Intimate partner violence:    Fear of current or ex partner: Not on file    Emotionally abused: Not on file    Physically abused: Not on file    Forced sexual activity: Not on file  Other Topics Concern  . Not on file  Social History Narrative  . Not on file  Review of Systems: See HPI, otherwise negative ROS  Physical Exam: BP 109/85   Pulse 79   Temp (!) 96.8 F (36 C) (Tympanic)   Resp 16   Ht 5\' 5"  (1.651 m)   Wt 74.8 kg   LMP  (LMP Unknown)   SpO2 100%   BMI 27.46 kg/m  General:   Alert,  pleasant and cooperative in NAD Head:  Normocephalic and atraumatic. Neck:  Supple; no masses or thyromegaly. Lungs:  Clear throughout to auscultation.    Heart:  Regular rate and rhythm. Abdomen:  Soft, nontender and nondistended. Normal bowel sounds, without guarding, and without rebound.   Neurologic:  Alert and  oriented x4;  grossly normal neurologically.  Impression/Plan: Melinda Palmer is now here to undergo a screening colonoscopy.  Risks, benefits, and alternatives regarding colonoscopy have been reviewed with the patient.  Questions have been answered.  All parties agreeable.

## 2018-09-07 NOTE — Anesthesia Postprocedure Evaluation (Signed)
Anesthesia Post Note  Patient: Melinda Palmer  Procedure(s) Performed: COLONOSCOPY WITH PROPOFOL (N/A )  Patient location during evaluation: Endoscopy Anesthesia Type: General Level of consciousness: awake and alert Pain management: pain level controlled Vital Signs Assessment: post-procedure vital signs reviewed and stable Respiratory status: spontaneous breathing, nonlabored ventilation, respiratory function stable and patient connected to nasal cannula oxygen Cardiovascular status: blood pressure returned to baseline and stable Postop Assessment: no apparent nausea or vomiting Anesthetic complications: no     Last Vitals:  Vitals:   09/07/18 0911 09/07/18 0921  BP: 131/87 114/82  Pulse: 92 80  Resp: 14 16  Temp:    SpO2: 100% 100%    Last Pain:  Vitals:   09/07/18 0921  TempSrc:   PainSc: 0-No pain                 Julyanna Scholle S

## 2018-09-07 NOTE — Anesthesia Post-op Follow-up Note (Signed)
Anesthesia QCDR form completed.        

## 2018-09-07 NOTE — Anesthesia Preprocedure Evaluation (Signed)
Anesthesia Evaluation  Patient identified by MRN, date of birth, ID band Patient awake    Reviewed: Allergy & Precautions, NPO status , Patient's Chart, lab work & pertinent test results, reviewed documented beta blocker date and time   Airway Mallampati: II  TM Distance: >3 FB     Dental  (+) Chipped   Pulmonary           Cardiovascular      Neuro/Psych  Headaches, PSYCHIATRIC DISORDERS Anxiety Depression    GI/Hepatic   Endo/Other    Renal/GU      Musculoskeletal   Abdominal   Peds  Hematology   Anesthesia Other Findings   Reproductive/Obstetrics                             Anesthesia Physical Anesthesia Plan  ASA: III  Anesthesia Plan: General   Post-op Pain Management:    Induction: Intravenous  PONV Risk Score and Plan:   Airway Management Planned:   Additional Equipment:   Intra-op Plan:   Post-operative Plan:   Informed Consent: I have reviewed the patients History and Physical, chart, labs and discussed the procedure including the risks, benefits and alternatives for the proposed anesthesia with the patient or authorized representative who has indicated his/her understanding and acceptance.       Plan Discussed with: CRNA  Anesthesia Plan Comments:         Anesthesia Quick Evaluation

## 2018-09-07 NOTE — Transfer of Care (Signed)
Immediate Anesthesia Transfer of Care Note  Patient: Melinda Palmer  Procedure(s) Performed: COLONOSCOPY WITH PROPOFOL (N/A )  Patient Location: PACU and Endoscopy Unit  Anesthesia Type:General  Level of Consciousness: drowsy  Airway & Oxygen Therapy: Patient Spontanous Breathing  Post-op Assessment: Report given to RN and Post -op Vital signs reviewed and stable  Post vital signs: Reviewed and stable  Last Vitals:  Vitals Value Taken Time  BP    Temp    Pulse    Resp    SpO2      Last Pain:  Vitals:   09/07/18 0759  TempSrc: Tympanic  PainSc: 0-No pain         Complications: No apparent anesthesia complications

## 2018-09-07 NOTE — Anesthesia Procedure Notes (Deleted)
Date/Time: 09/07/2018 8:31 AM Performed by: Eben Burow, CRNA

## 2018-09-07 NOTE — Op Note (Signed)
[facility] Gastroenterology Patient Name: Melinda Palmer Procedure Date: 09/07/2018 8:30 AM MRN: [id number] Account #: [id number] Date of Birth: 1961/05/17 Admit Type: Outpatient Age: 58 Room: [facility] ENDO ROOM 4 Gender: Female Note Status: Finalized Procedure:            Colonoscopy Indications:          Screening for colorectal malignant neoplasm Providers:            [name] MD, MD Referring MD:         No Local Md, MD (Referring MD) Medicines:            Propofol per Anesthesia Complications:        No immediate complications. Procedure:            Pre-Anesthesia Assessment:                       - Prior to the procedure, a History and Physical was                        performed, and patient medications and allergies were                        reviewed. The patient's tolerance of previous                        anesthesia was also reviewed. The risks and benefits of                        the procedure and the sedation options and risks were                        discussed with the patient. All questions were                        answered, and informed consent was obtained. Prior                        Anticoagulants: The patient has taken no previous                        anticoagulant or antiplatelet agents. ASA Grade                        Assessment: II - A patient with mild systemic disease.                        After reviewing the risks and benefits, the patient was                        deemed in satisfactory condition to undergo the                        procedure.                       After obtaining informed consent, the colonoscope was                        passed under direct vision. Throughout the procedure,  the patient's blood pressure, pulse, and oxygen                        saturations were monitored continuously. The                        Colonoscope was introduced through the anus and      advanced to the the cecum, identified by appendiceal                        orifice and ileocecal valve. The colonoscopy was                        performed without difficulty. The patient tolerated the                        procedure well. The quality of the bowel preparation                        was excellent. Findings:      The perianal and digital rectal examinations were normal.      A 3 mm polyp was found in the transverse colon. The polyp was sessile.       The polyp was removed with a cold biopsy forceps. Resection and       retrieval were complete.      A 3 mm polyp was found in the sigmoid colon. The polyp was sessile. The       polyp was removed with a cold biopsy forceps. Resection and retrieval       were complete.      Non-bleeding internal hemorrhoids were found during retroflexion. The       hemorrhoids were Grade II (internal hemorrhoids that prolapse but reduce       spontaneously). Impression:           - One 3 mm polyp in the transverse colon, removed with                        a cold biopsy forceps. Resected and retrieved.                       - One 3 mm polyp in the sigmoid colon, removed with a                        cold biopsy forceps. Resected and retrieved.                       - Non-bleeding internal hemorrhoids. Recommendation:       - Discharge patient to home.                       - Resume previous diet.                       - Continue present medications.                       - Await pathology results.                       - Repeat colonoscopy in 5 years if polyp adenoma and 10  years if hyperplastic Procedure Code(s):    --- Professional ---                       [phone], Colonoscopy, flexible; with biopsy, single or                        multiple Diagnosis Code(s):    --- Professional ---                       Z12.11, Encounter for screening for malignant neoplasm                        of colon                        D12.3, Benign neoplasm of transverse colon (hepatic                        flexure or splenic flexure)                       D12.5, Benign neoplasm of sigmoid colon CPT copyright 2018 American Medical Association. All rights reserved. The codes documented in this report are preliminary and upon coder review may  be revised to meet current compliance requirements. [name] MD, MD 09/07/2018 8:50:27 AM This report has been signed electronically. Number of Addenda: 0 Note Initiated On: 09/07/2018 8:30 AM Scope Withdrawal Time: 0 hours 7 minutes 18 seconds  Total Procedure Duration: 0 hours 11 minutes 8 seconds       [facility]

## 2018-09-08 ENCOUNTER — Encounter: Payer: Self-pay | Admitting: Gastroenterology

## 2018-09-08 LAB — SURGICAL PATHOLOGY

## 2018-09-09 ENCOUNTER — Encounter: Payer: Self-pay | Admitting: Gastroenterology

## 2018-09-09 ENCOUNTER — Other Ambulatory Visit: Payer: Self-pay

## 2018-09-13 ENCOUNTER — Other Ambulatory Visit: Payer: Self-pay

## 2018-09-13 MED ORDER — SERTRALINE HCL 50 MG PO TABS: 50.0000 mg | ORAL_TABLET | Freq: Every day | ORAL | 0 refills | Status: DC

## 2018-10-14 ENCOUNTER — Other Ambulatory Visit: Payer: Self-pay | Admitting: Nurse Practitioner

## 2018-10-14 DIAGNOSIS — F329 Major depressive disorder, single episode, unspecified: Secondary | ICD-10-CM

## 2018-10-14 MED ORDER — BUPROPION HCL 75 MG PO TABS: 75.0000 mg | ORAL_TABLET | Freq: Two times a day (BID) | ORAL | 3 refills | Status: DC

## 2018-12-07 ENCOUNTER — Other Ambulatory Visit: Payer: Self-pay

## 2018-12-07 MED ORDER — SERTRALINE HCL 50 MG PO TABS: 50.0000 mg | ORAL_TABLET | Freq: Every day | ORAL | 0 refills | Status: DC

## 2019-03-01 ENCOUNTER — Other Ambulatory Visit: Payer: Self-pay | Admitting: Nurse Practitioner

## 2019-03-01 MED ORDER — SERTRALINE HCL 50 MG PO TABS: 50.0000 mg | ORAL_TABLET | Freq: Every day | ORAL | 0 refills | Status: DC

## 2019-03-21 ENCOUNTER — Ambulatory Visit: Payer: Self-pay | Admitting: Nurse Practitioner

## 2019-04-15 ENCOUNTER — Ambulatory Visit: Payer: Self-pay | Admitting: Nurse Practitioner

## 2019-05-20 ENCOUNTER — Other Ambulatory Visit: Payer: Self-pay | Admitting: Internal Medicine

## 2019-05-21 NOTE — Telephone Encounter (Signed)
Pt has to wait until next follow up

## 2019-05-27 ENCOUNTER — Encounter: Payer: Self-pay | Admitting: Nurse Practitioner

## 2019-05-27 ENCOUNTER — Other Ambulatory Visit: Payer: Self-pay

## 2019-05-27 ENCOUNTER — Ambulatory Visit: Payer: Managed Care, Other (non HMO) | Admitting: Nurse Practitioner

## 2019-05-27 VITALS — BP 119/77 | HR 79 | Temp 97.4°F | Resp 16 | Ht 65.0 in | Wt 180.6 lb

## 2019-05-27 DIAGNOSIS — Z79899 Other long term (current) drug therapy: Secondary | ICD-10-CM

## 2019-05-27 DIAGNOSIS — F411 Generalized anxiety disorder: Secondary | ICD-10-CM

## 2019-05-27 DIAGNOSIS — E559 Vitamin D deficiency, unspecified: Secondary | ICD-10-CM | POA: Diagnosis not present

## 2019-05-27 LAB — POCT URINE DRUG SCREEN
POC Amphetamine UR: NOT DETECTED
POC BENZODIAZEPINES UR: NOT DETECTED
POC Barbiturate UR: NOT DETECTED
POC Cocaine UR: NOT DETECTED
POC Ecstasy UR: NOT DETECTED
POC Marijuana UR: NOT DETECTED
POC Methadone UR: NOT DETECTED
POC Methamphetamine UR: NOT DETECTED
POC Opiate Ur: NOT DETECTED
POC Oxycodone UR: NOT DETECTED
POC PHENCYCLIDINE UR: NOT DETECTED
POC TRICYCLICS UR: NOT DETECTED

## 2019-05-27 MED ORDER — ALPRAZOLAM 0.5 MG PO TABS: 0.5000 mg | ORAL_TABLET | Freq: Every evening | ORAL | 3 refills | Status: DC | PRN

## 2019-05-27 MED ORDER — SERTRALINE HCL 50 MG PO TABS: 50.0000 mg | ORAL_TABLET | Freq: Every day | ORAL | 0 refills | Status: DC

## 2019-05-27 NOTE — Progress Notes (Signed)
[facility] [place], [place] [address]  Internal MEDICINE  Office Visit Note  Patient Name: Melinda Palmer  [id number]  [id number]  Date of Service: 06/12/2019  Chief Complaint  Patient presents with  . Anxiety  . Depression  . Medication Refill    xanax and zoloft   . Quality Metric Gaps    pap smear    Ms. Lehne presents to clinic for routine follow-up. She has history of well controlled GAD. She takes sertraline 50mg  every day and will occassionally take alprazolam if needed for acute anxiety. She had labs done in 08/2018, showing vitamin d deficiency and were otherwise normal. Her mammogram was done 09/17/2018 and was benign. She will be due for pap smear at her next routine wellness visit.      Current Medication: Outpatient Encounter Medications as of 05/27/2019  Medication Sig  . ALPRAZolam (XANAX) 0.5 MG tablet Take 1 tablet (0.5 mg total) by mouth at bedtime as needed for anxiety.  . [DISCONTINUED] ALPRAZolam (XANAX) 0.5 MG tablet Take 1 tablet (0.5 mg total) by mouth at bedtime as needed for anxiety.  . [DISCONTINUED] buPROPion (WELLBUTRIN) 75 MG tablet Take 1 tablet (75 mg total) by mouth 2 (two) times daily. (Patient not taking: Reported on 05/27/2019)  . [DISCONTINUED] oxyCODONE (ROXICODONE) 5 MG immediate release tablet Take 1 tablet (5 mg total) by mouth every 6 (six) hours as needed for moderate pain. Do not drive while taking this medication. (Patient not taking: Reported on 09/07/2018)  . [DISCONTINUED] sertraline (ZOLOFT) 50 MG tablet Take 1 tablet (50 mg total) by mouth daily.  . [DISCONTINUED] sertraline (ZOLOFT) 50 MG tablet Take 1 tablet (50 mg total) by mouth daily.   No facility-administered encounter medications on file as of 05/27/2019.     Surgical History: Past Surgical History:  Procedure Laterality Date  . child birth natural  66  . COLONOSCOPY WITH PROPOFOL N/A 09/07/2018   Procedure: COLONOSCOPY WITH PROPOFOL;   Surgeon: [name], MD;  Location: [facility] ENDOSCOPY;  Service: Endoscopy;  Laterality: N/A;    Medical History: Past Medical History:  Diagnosis Date  . Anxiety   . Depression   . Migraines     Family History: Family History  Problem Relation Age of Onset  . Hypertension Neg Hx   . Diabetes Neg Hx   . Hyperlipidemia Neg Hx   . Lymphoma Neg Hx   . Migraines Neg Hx   . Glaucoma Neg Hx   . Stroke Neg Hx     Social History   Socioeconomic History  . Marital status: Single    Spouse name: Not on file  . Number of children: Not on file  . Years of education: Not on file  . Highest education level: Not on file  Occupational History  . Not on file  Social Needs  . Financial resource strain: Not on file  . Food insecurity    Worry: Not on file    Inability: Not on file  . Transportation needs    Medical: Not on file    Non-medical: Not on file  Tobacco Use  . Smoking status: Never Smoker  . Smokeless tobacco: Never Used  Substance and Sexual Activity  . Alcohol use: Yes    Comment: ocassionally   . Drug use: No  . Sexual activity: Not on file  Lifestyle  . Physical activity    Days per week: Not on file    Minutes per session: Not on file  .  Stress: Not on file  Relationships  . Social Herbalist on phone: Not on file    Gets together: Not on file    Attends religious service: Not on file    Active member of club or organization: Not on file    Attends meetings of clubs or organizations: Not on file    Relationship status: Not on file  . Intimate partner violence    Fear of current or ex partner: Not on file    Emotionally abused: Not on file    Physically abused: Not on file    Forced sexual activity: Not on file  Other Topics Concern  . Not on file  Social History Narrative  . Not on file      Review of Systems  Constitutional: Positive for unexpected weight change. Negative for activity change, chills and fever.  HENT: Negative for ear  pain, rhinorrhea, sinus pressure, sinus pain and sore throat.   Respiratory: Negative for shortness of breath and wheezing.   Cardiovascular: Negative for chest pain and palpitations.  Gastrointestinal: Negative for abdominal pain, diarrhea, nausea and vomiting.  Endocrine: Negative for cold intolerance, heat intolerance, polydipsia and polyuria.  Genitourinary: Negative for difficulty urinating, dysuria, frequency, urgency, vaginal bleeding and vaginal discharge.  Musculoskeletal: Negative for arthralgias and myalgias.  Skin: Negative for color change and wound.  Neurological: Negative for dizziness, numbness and headaches.  Hematological: Does not bruise/bleed easily.  Psychiatric/Behavioral: Positive for dysphoric mood. Negative for agitation.       Well managed .    Today's Vitals   05/27/19 1554  BP: 119/77  Pulse: 79  Resp: 16  Temp: (!) 97.4 F (36.3 C)  SpO2: 97%  Weight: 180 lb 9.6 oz (81.9 kg)  Height: 5\' 5"  (1.651 m)   Body mass index is 30.05 kg/m.  Physical Exam Vitals signs and nursing note reviewed.  Constitutional:      General: She is not in acute distress.    Appearance: Normal appearance. She is well-developed. She is not diaphoretic.  HENT:     Head: Normocephalic and atraumatic.     Nose: Nose normal.     Mouth/Throat:     Pharynx: No oropharyngeal exudate.  Eyes:     Extraocular Movements: Extraocular movements intact.     Pupils: Pupils are equal, round, and reactive to light.  Neck:     Musculoskeletal: Normal range of motion and neck supple.     Thyroid: No thyromegaly.     Vascular: No JVD.     Trachea: No tracheal deviation.  Cardiovascular:     Rate and Rhythm: Normal rate and regular rhythm.     Heart sounds: Normal heart sounds. No murmur. No friction rub. No gallop.   Pulmonary:     Effort: Pulmonary effort is normal. No respiratory distress.     Breath sounds: Normal breath sounds. No wheezing or rales.  Chest:     Chest wall: No  tenderness.  Abdominal:     General: Bowel sounds are normal.     Palpations: Abdomen is soft.     Tenderness: There is no abdominal tenderness.  Musculoskeletal: Normal range of motion.  Lymphadenopathy:     Cervical: No cervical adenopathy.  Skin:    General: Skin is warm and dry.  Neurological:     Mental Status: She is alert and oriented to person, place, and time.     Cranial Nerves: No cranial nerve deficit.  Psychiatric:  Behavior: Behavior normal.        Thought Content: Thought content normal.        Judgment: Judgment normal.    Assessment/Plan: 1. Vitamin D deficiency Continue OTC vitamin d everyday  2. Generalized anxiety disorder Continue sertraline everyday. May take alprazolam 0.5mg  at bedtime as needed for acute anxiety.  - ALPRAZolam (XANAX) 0.5 MG tablet; Take 1 tablet (0.5 mg total) by mouth at bedtime as needed for anxiety.  Dispense: 30 tablet; Refill: 3  3. Encounter for long-term (current) use of medications - POCT Urine Drug Screen negative for all controlled substances which is appropriate as she takes alprazolam only when needed.  General Counseling: kayler peery understanding of the findings of todays visit and agrees with plan of treatment. I have discussed any further diagnostic evaluation that may be needed or ordered today. We also reviewed her medications today. she has been encouraged to call the office with any questions or concerns that should arise related to todays visit.  This patient was seen by Leretha Pol FNP Collaboration with Dr Lavera Guise as a part of collaborative care agreement  Orders Placed This Encounter  Procedures  . POCT Urine Drug Screen    Meds ordered this encounter  Medications  . ALPRAZolam (XANAX) 0.5 MG tablet    Sig: Take 1 tablet (0.5 mg total) by mouth at bedtime as needed for anxiety.    Dispense:  30 tablet    Refill:  3    Order Specific Question:   Supervising Provider    Answer:   Lavera Guise [id number]  . DISCONTD: sertraline (ZOLOFT) 50 MG tablet    Sig: Take 1 tablet (50 mg total) by mouth daily.    Dispense:  90 tablet    Refill:  0    Order Specific Question:   Supervising Provider    Answer:   Lavera Guise [id number]    Time spent: 10 Minutes      Dr Lavera Guise Internal medicine

## 2019-06-05 ENCOUNTER — Other Ambulatory Visit: Payer: Self-pay | Admitting: Internal Medicine

## 2019-06-05 DIAGNOSIS — F411 Generalized anxiety disorder: Secondary | ICD-10-CM

## 2019-06-12 DIAGNOSIS — Z79899 Other long term (current) drug therapy: Secondary | ICD-10-CM | POA: Insufficient documentation

## 2019-09-26 ENCOUNTER — Other Ambulatory Visit: Payer: Managed Care, Other (non HMO) | Admitting: Nurse Practitioner

## 2019-10-24 ENCOUNTER — Other Ambulatory Visit: Payer: Managed Care, Other (non HMO) | Admitting: Nurse Practitioner

## 2019-11-16 ENCOUNTER — Telehealth: Payer: Self-pay

## 2019-11-16 NOTE — Telephone Encounter (Signed)
Lmom to confirm and screen for 11-18-19 ov. 

## 2019-11-17 ENCOUNTER — Telehealth: Payer: Self-pay

## 2019-11-17 NOTE — Telephone Encounter (Signed)
Confirmed appointment on 11/17/2019 and screened for covid. klh

## 2019-11-18 ENCOUNTER — Ambulatory Visit (INDEPENDENT_AMBULATORY_CARE_PROVIDER_SITE_OTHER): Payer: Managed Care, Other (non HMO) | Admitting: Nurse Practitioner

## 2019-11-18 ENCOUNTER — Encounter: Payer: Self-pay | Admitting: Nurse Practitioner

## 2019-11-18 VITALS — BP 123/57 | HR 83 | Temp 97.3°F | Resp 16 | Ht 65.0 in | Wt 174.6 lb

## 2019-11-18 DIAGNOSIS — Z0001 Encounter for general adult medical examination with abnormal findings: Secondary | ICD-10-CM | POA: Diagnosis not present

## 2019-11-18 DIAGNOSIS — R300 Dysuria: Secondary | ICD-10-CM

## 2019-11-18 DIAGNOSIS — F329 Major depressive disorder, single episode, unspecified: Secondary | ICD-10-CM | POA: Diagnosis not present

## 2019-11-18 DIAGNOSIS — F411 Generalized anxiety disorder: Secondary | ICD-10-CM

## 2019-11-18 DIAGNOSIS — Z124 Encounter for screening for malignant neoplasm of cervix: Secondary | ICD-10-CM

## 2019-11-18 DIAGNOSIS — Z1231 Encounter for screening mammogram for malignant neoplasm of breast: Secondary | ICD-10-CM

## 2019-11-18 DIAGNOSIS — Z79899 Other long term (current) drug therapy: Secondary | ICD-10-CM

## 2019-11-18 LAB — POCT URINE DRUG SCREEN
POC Amphetamine UR: NOT DETECTED
POC Barbiturate UR: NOT DETECTED
POC Cocaine UR: NOT DETECTED
POC Ecstasy UR: NOT DETECTED
POC Marijuana UR: NOT DETECTED
POC Methadone UR: NOT DETECTED
POC Methamphetamine UR: NOT DETECTED
POC Opiate Ur: NOT DETECTED
POC Oxycodone UR: NOT DETECTED
POC PHENCYCLIDINE UR: NOT DETECTED
POC TRICYCLICS UR: NOT DETECTED

## 2019-11-18 MED ORDER — ALPRAZOLAM 0.5 MG PO TABS: 0.5000 mg | ORAL_TABLET | Freq: Every evening | ORAL | 3 refills | Status: DC | PRN

## 2019-11-18 NOTE — Progress Notes (Signed)
[facility] [place], [place] [address]  Internal MEDICINE  Office Visit Note  Patient Name: Melinda Palmer  [id number]  [id number]  Date of Service: 12/03/2019   Pt is here for routine health maintenance examination  Chief Complaint  Patient presents with  . Annual Exam  . Gynecologic Exam  . Anxiety  . Depression  . Quality Metric Gaps    mammogram     The patient is here for health maintenance exam and pap smear. She states that she is doing well. She does not have any concerns or complaints. She is due to have routine, fasting labs. She is also due to have screening mammogram.     Current Medication: Outpatient Encounter Medications as of 11/18/2019  Medication Sig  . ALPRAZolam (XANAX) 0.5 MG tablet Take 1 tablet (0.5 mg total) by mouth at bedtime as needed for anxiety.  . sertraline (ZOLOFT) 50 MG tablet TAKE 1 TABLET BY MOUTH  DAILY  . [DISCONTINUED] ALPRAZolam (XANAX) 0.5 MG tablet Take 1 tablet (0.5 mg total) by mouth at bedtime as needed for anxiety.  . [DISCONTINUED] ALPRAZolam (XANAX) 0.5 MG tablet Take 1 tablet (0.5 mg total) by mouth at bedtime as needed for anxiety.   No facility-administered encounter medications on file as of 11/18/2019.    Surgical History: Past Surgical History:  Procedure Laterality Date  . child birth natural  65  . COLONOSCOPY WITH PROPOFOL N/A 09/07/2018   Procedure: COLONOSCOPY WITH PROPOFOL;  Surgeon: [name], MD;  Location: [facility] ENDOSCOPY;  Service: Endoscopy;  Laterality: N/A;    Medical History: Past Medical History:  Diagnosis Date  . Anxiety   . Depression   . Migraines     Family History: Family History  Problem Relation Age of Onset  . Hypertension Neg Hx   . Diabetes Neg Hx   . Hyperlipidemia Neg Hx   . Lymphoma Neg Hx   . Migraines Neg Hx   . Glaucoma Neg Hx   . Stroke Neg Hx       Review of Systems  Constitutional: Negative for activity change, chills, fatigue and  unexpected weight change.  HENT: Negative for congestion, postnasal drip, rhinorrhea, sneezing and sore throat.   Respiratory: Negative for cough, chest tightness, shortness of breath and wheezing.   Cardiovascular: Negative for chest pain and palpitations.  Gastrointestinal: Negative for abdominal pain, constipation, diarrhea, nausea and vomiting.  Endocrine: Negative for cold intolerance, heat intolerance, polydipsia and polyuria.  Genitourinary: Negative for dysuria, frequency, hematuria and urgency.  Musculoskeletal: Negative for arthralgias, back pain, joint swelling and neck pain.  Skin: Negative for rash.  Allergic/Immunologic: Negative for environmental allergies.  Neurological: Negative for dizziness, tremors, numbness and headaches.  Hematological: Negative for adenopathy. Does not bruise/bleed easily.  Psychiatric/Behavioral: Negative for behavioral problems (Depression), sleep disturbance and suicidal ideas. The patient is not nervous/anxious.        Depression/anxiety is well managed with current medication.     Today's Vitals   11/18/19 1454  BP: (!) 123/57  Pulse: 83  Resp: 16  Temp: (!) 97.3 F (36.3 C)  SpO2: 96%  Weight: 174 lb 9.6 oz (79.2 kg)  Height: 5\' 5"  (1.651 m)   Body mass index is 29.05 kg/m.  Physical Exam Vitals and nursing note reviewed.  Constitutional:      General: She is not in acute distress.    Appearance: Normal appearance. She is well-developed. She is not diaphoretic.  HENT:     Head:  Normocephalic and atraumatic.     Right Ear: Tympanic membrane normal.     Left Ear: Tympanic membrane normal.     Nose: Nose normal.     Mouth/Throat:     Pharynx: No oropharyngeal exudate.  Eyes:     Conjunctiva/sclera: Conjunctivae normal.     Pupils: Pupils are equal, round, and reactive to light.  Neck:     Thyroid: No thyromegaly.     Vascular: No carotid bruit or JVD.     Trachea: No tracheal deviation.  Cardiovascular:     Rate and Rhythm:  Normal rate and regular rhythm.     Pulses: Normal pulses.     Heart sounds: Normal heart sounds. No murmur. No friction rub. No gallop.   Pulmonary:     Effort: Pulmonary effort is normal. No respiratory distress.     Breath sounds: Normal breath sounds. No wheezing or rales.  Chest:     Chest wall: No tenderness.     Breasts:        Right: Normal. No swelling, bleeding, inverted nipple, mass, nipple discharge, skin change or tenderness.        Left: Normal. No swelling, bleeding, inverted nipple, mass, nipple discharge, skin change or tenderness.  Abdominal:     General: Bowel sounds are normal.     Palpations: Abdomen is soft.     Tenderness: There is no abdominal tenderness.  Genitourinary:    General: Normal vulva.     Exam position: Supine.     Labia:        Right: No tenderness or lesion.        Left: No tenderness or lesion.      Vagina: Normal. No vaginal discharge, erythema, tenderness or bleeding.     Cervix: No cervical motion tenderness, discharge, friability or erythema.     Uterus: Normal.      Adnexa: Right adnexa normal and left adnexa normal.     Comments: No tenderness, masses, or organomeglay present during bimanual exam . Musculoskeletal:        General: Normal range of motion.     Cervical back: Normal range of motion and neck supple.  Lymphadenopathy:     Cervical: No cervical adenopathy.     Upper Body:     Right upper body: No axillary adenopathy.     Left upper body: No axillary adenopathy.     Lower Body: No right inguinal adenopathy. No left inguinal adenopathy.  Skin:    General: Skin is warm and dry.  Neurological:     General: No focal deficit present.     Mental Status: She is alert and oriented to person, place, and time.     Cranial Nerves: No cranial nerve deficit.  Psychiatric:        Mood and Affect: Mood normal.        Behavior: Behavior normal.        Thought Content: Thought content normal.        Judgment: Judgment normal.       LABS: Recent Results (from the past 2160 hour(s))  Urinalysis, Routine w reflex microscopic     Status: None   Collection Time: 11/18/19  2:52 PM  Result Value Ref Range   Specific Gravity, UA 1.021 1.005 - 1.030   pH, UA 5.0 5.0 - 7.5   Color, UA Yellow Yellow   Appearance Ur Clear Clear   Leukocytes,UA Negative Negative   Protein,UA Negative Negative/Trace   Glucose, UA Negative  Negative   Ketones, UA Negative Negative   RBC, UA Negative Negative   Bilirubin, UA Negative Negative   Urobilinogen, Ur 0.2 0.2 - 1.0 mg/dL   Nitrite, UA Negative Negative   Microscopic Examination Comment     Comment: Microscopic not indicated and not performed.  IGP, Aptima HPV     Status: None   Collection Time: 11/18/19  2:52 PM  Result Value Ref Range   Interpretation NILM     Comment: NEGATIVE FOR INTRAEPITHELIAL LESION OR MALIGNANCY.   Category NIL     Comment: Negative for Intraepithelial Lesion   Adequacy ENDO     Comment: Satisfactory for evaluation. Endocervical and/or squamous metaplastic cells (endocervical component) are present.    Clinician Provided ICD10 Comment     Comment: Z12.4   Performed by: Comment     Comment: Jefm Bryant, Cytotechnologist (ASCP)   Note: Comment     Comment: The Pap smear is a screening test designed to aid in the detection of premalignant and malignant conditions of the uterine cervix.  It is not a diagnostic procedure and should not be used as the sole means of detecting cervical cancer.  Both false-positive and false-negative reports do occur.    Test Methodology Comment     Comment: This liquid based ThinPrep(R) pap test was screened with the use of an image guided system.    HPV Aptima Negative Negative    Comment: This nucleic acid amplification test detects fourteen high-risk HPV types (16,18,31,33,35,39,45,51,52,56,58,59,66,68) without differentiation.   POCT Urine Drug Screen     Status: None   Collection Time: 11/18/19   2:57 PM  Result Value Ref Range   POC METHAMPHETAMINE UR None Detected None Detected   POC Opiate Ur None Detected None Detected   POC Barbiturate UR None Detected None Detected   POC Amphetamine UR None Detected None Detected   POC Oxycodone UR None Detected None Detected   POC Cocaine UR None Detected None Detected   POC Ecstasy UR None Detected None Detected   POC TRICYCLICS UR None Detected None Detected   POC PHENCYCLIDINE UR None Detected None Detected   POC MARIJUANA UR None Detected None Detected   POC METHADONE UR None Detected None Detected   POC BENZODIAZEPINES UR     URINE TEMPERATURE     POC DRUG SCREEN OXIDANTS URINE     POC SPECIFIC GRAVITY URINE     POC PH URINE     Methylenedioxyamphetamine      Assessment/Plan: 1. Encounter for general adult medical examination with abnormal findings Annual health maintenance with pap smear.   2. Major depression, chronic Stable. Continue sertraline as prescribed   3. Generalized anxiety disorder May continue to take alprazolam 0.5mg  at bedtime as needed for acute anxiety/insomnia. New prescription sent to her pharmacy. - ALPRAZolam (XANAX) 0.5 MG tablet; Take 1 tablet (0.5 mg total) by mouth at bedtime as needed for anxiety.  Dispense: 30 tablet; Refill: 3  4. Routine cervical smear - IGP, Aptima HPV  5. Encounter for screening mammogram for malignant neoplasm of breast - MM DIGITAL SCREENING BILATERAL; Future  6. Encounter for long-term (current) use of medications - POCT Urine Drug Screen negative for controlled medications  7. Dysuria - Urinalysis, Routine w reflex microscopic  General Counseling: Melinda Palmer understanding of the findings of todays visit and agrees with plan of treatment. I have discussed any further diagnostic evaluation that may be needed or ordered today. We also reviewed her medications today. she  has been encouraged to call the office with any questions or concerns that should arise related  to todays visit.    Counseling:  This patient was seen by Leretha Pol FNP Collaboration with Dr Lavera Guise as a part of collaborative care agreement  Orders Placed This Encounter  Procedures  . MM DIGITAL SCREENING BILATERAL  . Urinalysis, Routine w reflex microscopic  . POCT Urine Drug Screen    Meds ordered this encounter  Medications  . DISCONTD: ALPRAZolam (XANAX) 0.5 MG tablet    Sig: Take 1 tablet (0.5 mg total) by mouth at bedtime as needed for anxiety.    Dispense:  30 tablet    Refill:  3    Order Specific Question:   Supervising Provider    Answer:   Lavera Guise [id number]  . ALPRAZolam (XANAX) 0.5 MG tablet    Sig: Take 1 tablet (0.5 mg total) by mouth at bedtime as needed for anxiety.    Dispense:  30 tablet    Refill:  3    Order Specific Question:   Supervising Provider    Answer:   Lavera Guise [id number]    Total time spent: 72 Minutes  Time spent includes review of chart, medications, test results, and follow up plan with the patient.     Lavera Guise, MD  Internal Medicine

## 2019-11-19 LAB — URINALYSIS, ROUTINE W REFLEX MICROSCOPIC
Bilirubin, UA: NEGATIVE
Glucose, UA: NEGATIVE
Ketones, UA: NEGATIVE
Leukocytes,UA: NEGATIVE
Nitrite, UA: NEGATIVE
Protein,UA: NEGATIVE
RBC, UA: NEGATIVE
Specific Gravity, UA: 1.021 (ref 1.005–1.030)
Urobilinogen, Ur: 0.2 mg/dL (ref 0.2–1.0)
pH, UA: 5 (ref 5.0–7.5)

## 2019-11-23 LAB — IGP, APTIMA HPV: HPV Aptima: NEGATIVE

## 2019-12-03 DIAGNOSIS — Z1231 Encounter for screening mammogram for malignant neoplasm of breast: Secondary | ICD-10-CM | POA: Insufficient documentation

## 2019-12-03 DIAGNOSIS — Z0001 Encounter for general adult medical examination with abnormal findings: Secondary | ICD-10-CM | POA: Insufficient documentation

## 2019-12-03 DIAGNOSIS — Z0000 Encounter for general adult medical examination without abnormal findings: Secondary | ICD-10-CM | POA: Insufficient documentation

## 2019-12-03 DIAGNOSIS — Z124 Encounter for screening for malignant neoplasm of cervix: Secondary | ICD-10-CM | POA: Insufficient documentation

## 2020-03-25 ENCOUNTER — Other Ambulatory Visit: Payer: Self-pay | Admitting: Internal Medicine

## 2020-03-25 DIAGNOSIS — F411 Generalized anxiety disorder: Secondary | ICD-10-CM

## 2020-05-21 ENCOUNTER — Ambulatory Visit: Payer: Managed Care, Other (non HMO) | Admitting: Nurse Practitioner

## 2020-05-22 ENCOUNTER — Encounter: Payer: Managed Care, Other (non HMO) | Admitting: Nurse Practitioner

## 2020-06-13 ENCOUNTER — Other Ambulatory Visit: Payer: Self-pay | Admitting: Internal Medicine

## 2020-06-13 DIAGNOSIS — F411 Generalized anxiety disorder: Secondary | ICD-10-CM

## 2020-07-03 ENCOUNTER — Encounter: Payer: Managed Care, Other (non HMO) | Admitting: Nurse Practitioner

## 2020-08-03 ENCOUNTER — Other Ambulatory Visit: Payer: Self-pay

## 2020-08-03 ENCOUNTER — Encounter: Payer: Self-pay | Admitting: Nurse Practitioner

## 2020-08-03 ENCOUNTER — Ambulatory Visit (INDEPENDENT_AMBULATORY_CARE_PROVIDER_SITE_OTHER): Payer: Managed Care, Other (non HMO) | Admitting: Nurse Practitioner

## 2020-08-03 VITALS — BP 127/83 | HR 98 | Temp 97.8°F | Resp 16 | Ht 65.0 in | Wt 172.2 lb

## 2020-08-03 DIAGNOSIS — F411 Generalized anxiety disorder: Secondary | ICD-10-CM

## 2020-08-03 DIAGNOSIS — Z0001 Encounter for general adult medical examination with abnormal findings: Secondary | ICD-10-CM

## 2020-08-03 DIAGNOSIS — R5383 Other fatigue: Secondary | ICD-10-CM

## 2020-08-03 DIAGNOSIS — R300 Dysuria: Secondary | ICD-10-CM

## 2020-08-03 MED ORDER — SERTRALINE HCL 100 MG PO TABS: 100.0000 mg | ORAL_TABLET | Freq: Every day | ORAL | 1 refills | Status: DC

## 2020-08-03 MED ORDER — ALPRAZOLAM 0.5 MG PO TABS: 0.5000 mg | ORAL_TABLET | Freq: Every evening | ORAL | 3 refills | Status: DC | PRN

## 2020-08-03 NOTE — Progress Notes (Signed)
[facility] [place], [place] [address]  Internal MEDICINE  Office Visit Note  Patient Name: Melinda Palmer  [id number]  [id number]  Date of Service: 08/11/2020   Pt is here for routine health maintenance examination  Chief Complaint  Patient presents with  . Annual Exam  . Depression     The patient presents for health maintenance exam.  -increased anxiety levels. 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 She fell a few weeks ago, fractured a few ribs and her pelvis. Patient's mother is gradually becoming more mobile.  -patient due to have routine, fasting labs -no new concerns or complaints.     Current Medication: Outpatient Encounter Medications as of 08/03/2020  Medication Sig  . [DISCONTINUED] ALPRAZolam (XANAX) 0.5 MG tablet Take 1 tablet (0.5 mg total) by mouth at bedtime as needed for anxiety.  . [DISCONTINUED] sertraline (ZOLOFT) 50 MG tablet TAKE 1 TABLET BY MOUTH  DAILY  . ALPRAZolam (XANAX) 0.5 MG tablet Take 1 tablet (0.5 mg total) by mouth at bedtime as needed for anxiety.  . sertraline (ZOLOFT) 100 MG tablet Take 1 tablet (100 mg total) by mouth daily.   No facility-administered encounter medications on file as of 08/03/2020.    Surgical History: Past Surgical History:  Procedure Laterality Date  . child birth natural  48  . COLONOSCOPY WITH PROPOFOL N/A 09/07/2018   Procedure: COLONOSCOPY WITH PROPOFOL;  Surgeon: [name], MD;  Location: [facility] ENDOSCOPY;  Service: Endoscopy;  Laterality: N/A;    Medical History: Past Medical History:  Diagnosis Date  . Anxiety   . Depression   . Migraines     Family History: Family History  Problem Relation Age of Onset  . Hypertension Neg Hx   . Diabetes Neg Hx   . Hyperlipidemia Neg Hx   . Lymphoma Neg Hx   . Migraines Neg Hx   . Glaucoma Neg Hx   . Stroke Neg Hx       Review of Systems  Constitutional: Negative for activity change, chills, fatigue and unexpected weight  change.  HENT: Negative for congestion, postnasal drip, rhinorrhea, sneezing and sore throat.   Respiratory: Negative for cough, chest tightness, shortness of breath and wheezing.   Cardiovascular: Negative for chest pain and palpitations.  Gastrointestinal: Negative for abdominal pain, constipation, diarrhea, nausea and vomiting.  Endocrine: Negative for cold intolerance, heat intolerance, polydipsia and polyuria.  Genitourinary: Negative for dysuria, frequency, hematuria and urgency.  Musculoskeletal: Negative for arthralgias, back pain, joint swelling and neck pain.  Skin: Negative for rash.  Allergic/Immunologic: Negative for environmental allergies.  Neurological: Negative for dizziness, tremors, numbness and headaches.  Hematological: Negative for adenopathy. Does not bruise/bleed easily.  Psychiatric/Behavioral: Negative for behavioral problems (Depression), sleep disturbance and suicidal ideas. The patient is nervous/anxious.        Depression/anxiety is well managed with current medication.     Today's Vitals   08/03/20 1020  BP: 127/83  Pulse: 98  Resp: 16  Temp: 97.8 F (36.6 C)  SpO2: 99%  Weight: 172 lb 3.2 oz (78.1 kg)  Height: 5\' 5"  (1.651 m)   Body mass index is 28.66 kg/m.  Physical Exam Vitals and nursing note reviewed.  Constitutional:      General: She is not in acute distress.    Appearance: Normal appearance. She is well-developed. She is not diaphoretic.  HENT:     Head: Normocephalic and atraumatic.     Nose: Nose normal.     Mouth/Throat:  Pharynx: No oropharyngeal exudate.  Eyes:     Extraocular Movements: Extraocular movements intact.     Pupils: Pupils are equal, round, and reactive to light.  Neck:     Thyroid: No thyromegaly.     Vascular: No JVD.     Trachea: No tracheal deviation.  Cardiovascular:     Rate and Rhythm: Normal rate and regular rhythm.     Heart sounds: Normal heart sounds. No murmur heard. No friction rub. No gallop.    Pulmonary:     Effort: Pulmonary effort is normal. No respiratory distress.     Breath sounds: Normal breath sounds. No wheezing or rales.  Chest:     Chest wall: No tenderness.  Abdominal:     Palpations: Abdomen is soft.  Musculoskeletal:        General: Normal range of motion.     Cervical back: Normal range of motion and neck supple.  Lymphadenopathy:     Cervical: No cervical adenopathy.  Skin:    General: Skin is warm and dry.     Capillary Refill: Capillary refill takes less than 2 seconds.  Neurological:     General: No focal deficit present.     Mental Status: She is alert and oriented to person, place, and time.     Cranial Nerves: No cranial nerve deficit.  Psychiatric:        Mood and Affect: Mood normal.        Behavior: Behavior normal.        Thought Content: Thought content normal.        Judgment: Judgment normal.      LABS: Recent Results (from the past 2160 hour(s))  UA/M w/rflx Culture, Routine     Status: None   Collection Time: 08/03/20 10:20 AM   Specimen: Urine   Urine  Result Value Ref Range   Specific Gravity, UA 1.026 1.005 - 1.030   pH, UA 6.5 5.0 - 7.5   Color, UA Yellow Yellow   Appearance Ur Clear Clear   Leukocytes,UA Negative Negative   Protein,UA Negative Negative/Trace   Glucose, UA Negative Negative   Ketones, UA Negative Negative   RBC, UA Negative Negative   Bilirubin, UA Negative Negative   Urobilinogen, Ur 1.0 0.2 - 1.0 mg/dL   Nitrite, UA Negative Negative   Microscopic Examination Comment     Comment: Microscopic follows if indicated.   Microscopic Examination See below:     Comment: Microscopic was indicated and was performed.   Urinalysis Reflex Comment     Comment: This specimen will not reflex to a Urine Culture.  Microscopic Examination     Status: None   Collection Time: 08/03/20 10:20 AM   Urine  Result Value Ref Range   WBC, UA None seen 0 - 5 /hpf   RBC 0-2 0 - 2 /hpf   Epithelial Cells (non renal) None  seen 0 - 10 /hpf   Casts None seen None seen /lpf   Bacteria, UA None seen None seen/Few    Assessment/Plan: 1. Encounter for general adult medical examination with abnormal findings Annual health maintenance exam today. Order slip given to have routine, fasting labs  2. Other fatigue Will get labs done and include thyroid and anemia panels.  3. Generalized anxiety disorder Well managed with current medication. Take zoloft every day. May take alprazolam 0.5mg  tablets at bedtime when  Needed for acute anxiety. New prescriptions sent to pharmacy today.  - sertraline (ZOLOFT) 100 MG tablet; Take  1 tablet (100 mg total) by mouth daily.  Dispense: 90 tablet; Refill: 1 - ALPRAZolam (XANAX) 0.5 MG tablet; Take 1 tablet (0.5 mg total) by mouth at bedtime as needed for anxiety.  Dispense: 30 tablet; Refill: 3  4. Dysuria - UA/M w/rflx Culture, Routine  General Counseling: Sandy Salaam understanding of the findings of todays visit and agrees with plan of treatment. I have discussed any further diagnostic evaluation that may be needed or ordered today. We also reviewed her medications today. she has been encouraged to call the office with any questions or concerns that should arise related to todays visit.    Counseling:  This patient was seen by Leretha Pol FNP Collaboration with Dr Lavera Guise as a part of collaborative care agreement  Orders Placed This Encounter  Procedures  . Microscopic Examination  . UA/M w/rflx Culture, Routine    Meds ordered this encounter  Medications  . sertraline (ZOLOFT) 100 MG tablet    Sig: Take 1 tablet (100 mg total) by mouth daily.    Dispense:  90 tablet    Refill:  1    Increased dose to 100mg  daily    Order Specific Question:   Supervising Provider    Answer:   Lavera Guise [employer]  . ALPRAZolam (XANAX) 0.5 MG tablet    Sig: Take 1 tablet (0.5 mg total) by mouth at bedtime as needed for anxiety.    Dispense:  30 tablet    Refill:  3     Order Specific Question:   Supervising Provider    Answer:   Lavera Guise [id number]    Total time spent: 40 Minutes  Time spent includes review of chart, medications, test results, and follow up plan with the patient.     Lavera Guise, MD  Internal Medicine

## 2020-08-04 LAB — UA/M W/RFLX CULTURE, ROUTINE
Bilirubin, UA: NEGATIVE
Glucose, UA: NEGATIVE
Ketones, UA: NEGATIVE
Leukocytes,UA: NEGATIVE
Nitrite, UA: NEGATIVE
Protein,UA: NEGATIVE
RBC, UA: NEGATIVE
Specific Gravity, UA: 1.026 (ref 1.005–1.030)
Urobilinogen, Ur: 1 mg/dL (ref 0.2–1.0)
pH, UA: 6.5 (ref 5.0–7.5)

## 2020-08-04 LAB — MICROSCOPIC EXAMINATION
Bacteria, UA: NONE SEEN
Casts: NONE SEEN /lpf
Epithelial Cells (non renal): NONE SEEN /hpf (ref 0–10)
WBC, UA: NONE SEEN /hpf (ref 0–5)

## 2020-12-18 ENCOUNTER — Other Ambulatory Visit: Payer: Self-pay | Admitting: Nurse Practitioner

## 2020-12-18 DIAGNOSIS — F411 Generalized anxiety disorder: Secondary | ICD-10-CM

## 2021-05-15 ENCOUNTER — Encounter: Payer: Self-pay | Admitting: Nurse Practitioner

## 2021-05-15 ENCOUNTER — Ambulatory Visit (INDEPENDENT_AMBULATORY_CARE_PROVIDER_SITE_OTHER): Payer: Managed Care, Other (non HMO) | Admitting: Nurse Practitioner

## 2021-05-15 ENCOUNTER — Other Ambulatory Visit: Payer: Self-pay

## 2021-05-15 VITALS — BP 116/75 | HR 76 | Temp 97.7°F | Ht 65.0 in | Wt 174.3 lb

## 2021-05-15 DIAGNOSIS — Z1231 Encounter for screening mammogram for malignant neoplasm of breast: Secondary | ICD-10-CM

## 2021-05-15 DIAGNOSIS — Z6829 Body mass index (BMI) 29.0-29.9, adult: Secondary | ICD-10-CM | POA: Diagnosis not present

## 2021-05-15 DIAGNOSIS — F4381 Prolonged grief disorder: Secondary | ICD-10-CM | POA: Diagnosis not present

## 2021-05-15 DIAGNOSIS — Z7689 Persons encountering health services in other specified circumstances: Secondary | ICD-10-CM | POA: Diagnosis not present

## 2021-05-15 DIAGNOSIS — F411 Generalized anxiety disorder: Secondary | ICD-10-CM | POA: Diagnosis not present

## 2021-05-15 DIAGNOSIS — F4329 Adjustment disorder with other symptoms: Secondary | ICD-10-CM

## 2021-05-15 MED ORDER — SERTRALINE HCL 100 MG PO TABS: 100.0000 mg | ORAL_TABLET | Freq: Every day | ORAL | 1 refills | Status: DC

## 2021-05-15 NOTE — Progress Notes (Signed)
New Patient Office Visit  Subjective:  Patient ID: Melinda Palmer, female    DOB: 10-Oct-1960  Age: 60 y.o. MRN: [id number]  CC:  Chief Complaint  Patient presents with   New Patient (Initial Visit)    HPI HENNY STRAUCH presents to establish new primary care provider. Struggling with depression since the passing of her mother in June, 2022.  Patient has not had any grief counseling at this point.  She did not realize this was an option.  She plans to look into this.  Physically, she states she feels well.  Has no concerns or complaints.  She denies chest pain, chest pressure, or shortness of breath. She denies headaches or visual disturbances. She denies abdominal pain, nausea, vomiting, or changes in bowel or bladder habits.   She does need to have refills for both her sertraline.  She feels like anxiety and depression are well managed with current dose of sertraline.  She rarely takes prescribed alprazolam.  Has several left and current bottle and does not require refills at this time.   Past Surgical History:  Procedure Laterality Date   child birth natural  29   COLONOSCOPY WITH PROPOFOL N/A 09/07/2018   Procedure: COLONOSCOPY WITH PROPOFOL;  Surgeon: [name], MD;  Location: [facility] ENDOSCOPY;  Service: Endoscopy;  Laterality: N/A;    Family History  Problem Relation Age of Onset   Cancer Father    Heart attack Father    Stroke Maternal Grandmother    Diabetes Maternal Grandmother    Diabetes Paternal Grandmother    Hypertension Neg Hx    Hyperlipidemia Neg Hx    Lymphoma Neg Hx    Migraines Neg Hx    Glaucoma Neg Hx     Social History   Socioeconomic History   Marital status: Single    Spouse name: Not on file   Number of children: Not on file   Years of education: Not on file   Highest education level: Not on file  Occupational History   Not on file  Tobacco Use   Smoking status: Never   Smokeless tobacco: Never  Vaping Use   Vaping Use: Never used   Substance and Sexual Activity   Alcohol use: Yes    Comment: ocassionally    Drug use: No   Sexual activity: Yes    Partners: Male  Other Topics Concern   Not on file  Social History Narrative   Not on file   Social Determinants of Health   Financial Resource Strain: Not on file  Food Insecurity: Not on file  Transportation Needs: Not on file  Physical Activity: Not on file  Stress: Not on file  Social Connections: Not on file  Intimate Partner Violence: Not on file    ROS Review of Systems  Constitutional:  Positive for fatigue. Negative for activity change, appetite change, chills and fever.  HENT:  Negative for congestion, postnasal drip, rhinorrhea, sinus pressure, sinus pain, sneezing and sore throat.   Eyes: Negative.   Respiratory:  Negative for cough, chest tightness, shortness of breath and wheezing.   Cardiovascular:  Negative for chest pain and palpitations.  Gastrointestinal:  Negative for abdominal pain, constipation, diarrhea, nausea and vomiting.  Endocrine: Negative for cold intolerance, heat intolerance, polydipsia and polyuria.  Genitourinary:  Negative for dyspareunia, dysuria, flank pain, frequency and urgency.  Musculoskeletal:  Negative for arthralgias, back pain and myalgias.  Skin:  Negative for rash.  Allergic/Immunologic: Negative for environmental allergies.  Neurological:  Negative for dizziness, weakness and headaches.  Hematological:  Negative for adenopathy.  Psychiatric/Behavioral:  Positive for dysphoric mood. The patient is nervous/anxious.    Objective:   Today's Vitals   05/15/21 1557  BP: 116/75  Pulse: 76  Temp: 97.7 F (36.5 C)  SpO2: 98%  Weight: 174 lb 4.8 oz (79.1 kg)  Height: 5\' 5"  (1.651 m)   Body mass index is 29.01 kg/m.   Physical Exam Vitals and nursing note reviewed.  Constitutional:      Appearance: Normal appearance. She is well-developed.  HENT:     Head: Normocephalic.  Eyes:     Pupils: Pupils are  equal, round, and reactive to light.  Cardiovascular:     Rate and Rhythm: Normal rate and regular rhythm.     Pulses: Normal pulses.     Heart sounds: Normal heart sounds.  Pulmonary:     Effort: Pulmonary effort is normal.     Breath sounds: Normal breath sounds.  Abdominal:     Palpations: Abdomen is soft.  Musculoskeletal:        General: Normal range of motion.     Cervical back: Normal range of motion and neck supple.  Lymphadenopathy:     Cervical: No cervical adenopathy.  Skin:    General: Skin is warm and dry.     Capillary Refill: Capillary refill takes less than 2 seconds.  Neurological:     General: No focal deficit present.     Mental Status: She is alert and oriented to person, place, and time.  Psychiatric:        Attention and Perception: Attention and perception normal.        Mood and Affect: Mood is anxious. Affect is tearful.        Speech: Speech normal.        Behavior: Behavior normal. Behavior is cooperative.        Thought Content: Thought content normal.        Cognition and Memory: Cognition and memory normal.        Judgment: Judgment normal.    Assessment & Plan:  1. Encounter to establish care Appointment today to establish new primary care provider    2. Generalized anxiety disorder Renew patient's prescription for sertraline 100 mg tablets. - sertraline (ZOLOFT) 100 MG tablet; Take 1 tablet (100 mg total) by mouth daily.  Dispense: 90 tablet; Refill: 1  3. Grief reaction with prolonged bereavement Patient grieving normally after loss of her mother in June, 2022.  Recommended grief counseling and gave her name and phone number of local grief counseling provider.  Patient plans to Make contact for new patient appointment.  4. Body mass index 29.0-29.9, adult Discussed lowering calorie intake to 1500 calories per day and incorporating exercise into daily routine to help lose weight. Will monitor.   5. Encounter for screening mammogram for  malignant neoplasm of breast Order for screening mammogram placed today. - MM DIGITAL SCREENING BILATERAL; Future   Problem List Items Addressed This Visit       Other   Generalized anxiety disorder   Relevant Medications   sertraline (ZOLOFT) 100 MG tablet   Encounter for screening mammogram for malignant neoplasm of breast   Relevant Orders   MM DIGITAL SCREENING BILATERAL   Grief reaction with prolonged bereavement   Body mass index 29.0-29.9, adult   Other Visit Diagnoses     Encounter to establish care    -  Primary  Outpatient Encounter Medications as of 05/15/2021  Medication Sig   ALPRAZolam (XANAX) 0.5 MG tablet Take 1 tablet (0.5 mg total) by mouth at bedtime as needed for anxiety.   sertraline (ZOLOFT) 100 MG tablet Take 1 tablet (100 mg total) by mouth daily.   [DISCONTINUED] sertraline (ZOLOFT) 100 MG tablet Take 1 tablet (100 mg total) by mouth daily.   [DISCONTINUED] sertraline (ZOLOFT) 100 MG tablet Take 1 tablet (100 mg total) by mouth daily.   No facility-administered encounter medications on file as of 05/15/2021.    Follow-up: Return in about 4 months (around 09/15/2021) for health maintenance exam - ordered mammogram today - [facility] .   Ronnell Freshwater, NP  This note was dictated using Systems analyst. Rapid proofreading was performed to expedite the delivery of the information. Despite proofreading, phonetic errors will occur which are common with this voice recognition software. Please take this into consideration. If there are any concerns, please contact our office.

## 2021-05-22 DIAGNOSIS — F4329 Adjustment disorder with other symptoms: Secondary | ICD-10-CM | POA: Insufficient documentation

## 2021-05-22 DIAGNOSIS — Z6829 Body mass index (BMI) 29.0-29.9, adult: Secondary | ICD-10-CM | POA: Insufficient documentation

## 2021-05-22 DIAGNOSIS — Z6828 Body mass index (BMI) 28.0-28.9, adult: Secondary | ICD-10-CM | POA: Insufficient documentation

## 2021-05-22 DIAGNOSIS — F4381 Prolonged grief disorder: Secondary | ICD-10-CM | POA: Insufficient documentation

## 2021-05-22 NOTE — Patient Instructions (Signed)

## 2021-09-16 ENCOUNTER — Encounter: Payer: Managed Care, Other (non HMO) | Admitting: Nurse Practitioner

## 2021-10-10 ENCOUNTER — Other Ambulatory Visit: Payer: Self-pay

## 2021-10-10 ENCOUNTER — Ambulatory Visit (INDEPENDENT_AMBULATORY_CARE_PROVIDER_SITE_OTHER): Payer: Managed Care, Other (non HMO) | Admitting: Nurse Practitioner

## 2021-10-10 ENCOUNTER — Encounter: Payer: Self-pay | Admitting: Nurse Practitioner

## 2021-10-10 VITALS — BP 101/66 | HR 75 | Temp 98.7°F | Ht 64.96 in | Wt 172.8 lb

## 2021-10-10 DIAGNOSIS — F411 Generalized anxiety disorder: Secondary | ICD-10-CM | POA: Diagnosis not present

## 2021-10-10 DIAGNOSIS — R5383 Other fatigue: Secondary | ICD-10-CM

## 2021-10-10 DIAGNOSIS — E559 Vitamin D deficiency, unspecified: Secondary | ICD-10-CM | POA: Diagnosis not present

## 2021-10-10 DIAGNOSIS — Z0000 Encounter for general adult medical examination without abnormal findings: Secondary | ICD-10-CM

## 2021-10-10 DIAGNOSIS — Z0001 Encounter for general adult medical examination with abnormal findings: Secondary | ICD-10-CM | POA: Diagnosis not present

## 2021-10-10 DIAGNOSIS — Z6828 Body mass index (BMI) 28.0-28.9, adult: Secondary | ICD-10-CM

## 2021-10-10 MED ORDER — SERTRALINE HCL 100 MG PO TABS: 100.0000 mg | ORAL_TABLET | Freq: Every day | ORAL | 1 refills | Status: DC

## 2021-10-10 NOTE — Progress Notes (Signed)
Established patient visit ? ? ?Patient: Melinda Palmer   DOB: 1960/12/14   61 y.o. Female  MRN: [id number] ?Visit Date: 10/10/2021 ? ? ?Chief Complaint  ?Patient presents with  ? Annual Exam  ? ?Subjective  ?  ?HPI  ?The patient presents for annual wellness visit.  ?-she has no concerns or complaints.  ?-screening mammogram done 05/24/2021 at [facility] imaging with benign results.  ?-due to have routine, fasting labs ?-doing well with increased dose sertraline. Does not remember the last time she felt the need to take as needed alprazolam.  ?-no new concerns or complaints today  ? ? ?Medications: ?Outpatient Medications Prior to Visit  ?Medication Sig  ? ALPRAZolam (XANAX) 0.5 MG tablet Take 1 tablet (0.5 mg total) by mouth at bedtime as needed for anxiety.  ? [DISCONTINUED] sertraline (ZOLOFT) 100 MG tablet Take 1 tablet (100 mg total) by mouth daily.  ? ?No facility-administered medications prior to visit.  ? ? ?Review of Systems  ?Constitutional:  Negative for activity change, appetite change, chills, fatigue and fever.  ?HENT:  Negative for congestion, postnasal drip, rhinorrhea, sinus pressure, sinus pain, sneezing and sore throat.   ?Eyes: Negative.   ?Respiratory:  Negative for cough, chest tightness, shortness of breath and wheezing.   ?Cardiovascular:  Negative for chest pain and palpitations.  ?Gastrointestinal:  Negative for abdominal pain, constipation, diarrhea, nausea and vomiting.  ?Endocrine: Negative for cold intolerance, heat intolerance, polydipsia and polyuria.  ?Genitourinary:  Negative for dyspareunia, dysuria, flank pain, frequency and urgency.  ?Musculoskeletal:  Negative for arthralgias, back pain and myalgias.  ?Skin:  Negative for rash.  ?Allergic/Immunologic: Negative for environmental allergies.  ?Neurological:  Negative for dizziness, weakness and headaches.  ?Hematological:  Negative for adenopathy.  ?Psychiatric/Behavioral:  The patient is not nervous/anxious.   ? ? ? ? Objective  ?   ? ?Today's Vitals  ? 10/10/21 1543  ?BP: 101/66  ?Pulse: 75  ?Temp: 98.7 ?F (37.1 ?C)  ?SpO2: 96%  ?Weight: 172 lb 12.8 oz (78.4 kg)  ?Height: 5' 4.96" (1.65 m)  ? ?Body mass index is 28.79 kg/m?.  ? ?BP Readings from Last 3 Encounters:  ?10/10/21 101/66  ?05/15/21 116/75  ?08/03/20 127/83  ?  ?Wt Readings from Last 3 Encounters:  ?10/10/21 172 lb 12.8 oz (78.4 kg)  ?05/15/21 174 lb 4.8 oz (79.1 kg)  ?08/03/20 172 lb 3.2 oz (78.1 kg)  ?  ?Physical Exam ?Vitals and nursing note reviewed.  ?Constitutional:   ?   Appearance: Normal appearance. She is well-developed.  ?HENT:  ?   Head: Normocephalic and atraumatic.  ?   Right Ear: Tympanic membrane, ear canal and external ear normal.  ?   Left Ear: Tympanic membrane, ear canal and external ear normal.  ?   Nose: Nose normal.  ?   Mouth/Throat:  ?   Mouth: Mucous membranes are moist.  ?   Pharynx: Oropharynx is clear.  ?Eyes:  ?   Extraocular Movements: Extraocular movements intact.  ?   Conjunctiva/sclera: Conjunctivae normal.  ?   Pupils: Pupils are equal, round, and reactive to light.  ?Cardiovascular:  ?   Rate and Rhythm: Normal rate and regular rhythm.  ?   Pulses: Normal pulses.  ?   Heart sounds: Normal heart sounds.  ?Pulmonary:  ?   Effort: Pulmonary effort is normal.  ?   Breath sounds: Normal breath sounds.  ?Abdominal:  ?   General: Bowel sounds are normal. There is no distension.  ?  Palpations: Abdomen is soft. There is no mass.  ?   Tenderness: There is no abdominal tenderness. There is no guarding or rebound.  ?   Hernia: No hernia is present.  ?Musculoskeletal:     ?   General: Normal range of motion.  ?   Cervical back: Normal range of motion and neck supple.  ?Lymphadenopathy:  ?   Cervical: No cervical adenopathy.  ?Skin: ?   General: Skin is warm and dry.  ?   Capillary Refill: Capillary refill takes less than 2 seconds.  ?Neurological:  ?   General: No focal deficit present.  ?   Mental Status: She is alert and oriented to person, place, and  time.  ?Psychiatric:     ?   Mood and Affect: Mood normal.     ?   Behavior: Behavior normal.     ?   Thought Content: Thought content normal.     ?   Judgment: Judgment normal.  ?  ? ? Assessment & Plan  ?  ?1. Encounter for general adult medical examination with abnormal findings ?Annual wellness visit today.  ? ?2. Other fatigue ?Check routine labs including CBC and thyroid panel  ?- CBC with Differential/Platelet; Future ?- T4, free; Future ?- TSH; Future ?- Comprehensive metabolic panel; Future ?- Comprehensive metabolic panel ?- TSH ?- T4, free ?- CBC with Differential/Platelet ? ?3. Generalized anxiety disorder ?Well managed. Continue sertraline '100mg'$  daily. New rx sent to [facility]  ?- sertraline (ZOLOFT) 100 MG tablet; Take 1 tablet (100 mg total) by mouth daily.  Dispense: 90 tablet; Refill: 1 ? ?4. Vitamin D deficiency ?Check vitamin d level and treat deficiency as indicated.  ?- Vitamin D 1,25 dihydroxy; Future ?- Vitamin D 1,25 dihydroxy ? ?5. Body mass index 28.0-28.9, adult ?Discussed lowering calorie intake to 1500 calories per day and incorporating exercise into daily routine to help lose weight. .  ?- T4, free; Future ?- TSH; Future ?- TSH ?- T4, free ? ?6. Healthcare maintenance ?Routine, fasting labs drawn during today's visit.  ?- CBC with Differential/Platelet; Future ?- T4, free; Future ?- TSH; Future ?- Lipid panel; Future ?- Comprehensive metabolic panel; Future ?- Comprehensive metabolic panel ?- Lipid panel ?- TSH ?- T4, free ?- CBC with Differential/Platelet  ? ? ?Problem List Items Addressed This Visit   ? ?  ? Other  ? Other fatigue  ? Relevant Orders  ? CBC with Differential/Platelet  ? T4, free  ? TSH  ? Comprehensive metabolic panel  ? Generalized anxiety disorder  ? Relevant Medications  ? sertraline (ZOLOFT) 100 MG tablet  ? Vitamin D deficiency  ? Relevant Orders  ? Vitamin D 1,25 dihydroxy  ? Encounter for general adult medical examination with abnormal findings - Primary  ? Body  mass index 28.0-28.9, adult  ? Relevant Orders  ? T4, free  ? TSH  ? ?Other Visit Diagnoses   ? ? Healthcare maintenance      ? Relevant Orders  ? CBC with Differential/Platelet  ? T4, free  ? TSH  ? Lipid panel  ? Comprehensive metabolic panel  ? ?  ?  ? ?Return in about 4 months (around 02/09/2022) for blood pressure, mood .  ?   ? ? ? ? ?Ronnell Freshwater, NP  ?[facility] ?[phone] (phone) ?[phone] (fax) ? ?[facility]  ?

## 2021-10-10 NOTE — Patient Instructions (Signed)
Fat and Cholesterol Restricted Eating Plan Getting too much fat and cholesterol in your diet may cause health problems. Choosing the right foods helps keep your fat and cholesterol at normal levels. This can keep you from getting certain diseases. Your doctor may recommend an eating plan that includes: Total fat: ______% or less of total calories a day. This is ______g of fat a day. Saturated fat: ______% or less of total calories a day. This is ______g of saturated fat a day. Cholesterol: less than _________mg a day. Fiber: ______g a day. What are tips for following this plan? General tips Work with your doctor to lose weight if you need to. Avoid: Foods with added sugar. Fried foods. Foods with trans fat or partially hydrogenated oils. This includes some margarines and baked goods. If you drink alcohol: Limit how much you have to: 0-1 drink a day for women who are not pregnant. 0-2 drinks a day for men. Know how much alcohol is in a drink. In the [place] one drink equals one 12 oz bottle of beer (355 mL), one 5 oz glass of wine (148 mL), or one 1 oz glass of hard liquor (44 mL). Reading food labels Check food labels for: Trans fats. Partially hydrogenated oils. Saturated fat (g) in each serving. Cholesterol (mg) in each serving. Fiber (g) in each serving. Choose foods with healthy fats, such as: Monounsaturated fats and polyunsaturated fats. These include olive and canola oil, flaxseeds, walnuts, almonds, and seeds. Omega-3 fats. These are found in certain fish, flaxseed oil, and ground flaxseeds. Choose grain products that have whole grains. Look for the word "whole" as the first word in the ingredient list. Cooking Cook foods using low-fat methods. These include baking, boiling, grilling, and broiling. Eat more home-cooked foods. Eat at restaurants and buffets less often. Eat less fast food. Avoid cooking using saturated fats, such as butter, cream, palm oil, palm kernel oil, and  coconut oil. Meal planning  At meals, divide your plate into four equal parts: Fill one-half of your plate with vegetables, green salads, and fruit. Fill one-fourth of your plate with whole grains. Fill one-fourth of your plate with low-fat (lean) protein foods. Eat fish that is high in omega-3 fats at least two times a week. This includes mackerel, tuna, sardines, and salmon. Eat foods that are high in fiber, such as whole grains, beans, apples, pears, berries, broccoli, carrots, peas, and barley. What foods should I eat? Fruits All fresh, canned (in natural juice), or frozen fruits. Vegetables Fresh or frozen vegetables (raw, steamed, roasted, or grilled). Green salads. Grains Whole grains, such as whole wheat or whole grain breads, crackers, cereals, and pasta. Unsweetened oatmeal, bulgur, barley, quinoa, or brown rice. Corn or whole wheat flour tortillas. Meats and other protein foods Ground beef (85% or leaner), grass-fed beef, or beef trimmed of fat. Skinless chicken or turkey. Ground chicken or turkey. Pork trimmed of fat. All fish and seafood. Egg whites. Dried beans, peas, or lentils. Unsalted nuts or seeds. Unsalted canned beans. Nut butters without added sugar or oil. Dairy Low-fat or nonfat dairy products, such as skim or 1% milk, 2% or reduced-fat cheeses, low-fat and fat-free ricotta or cottage cheese, or plain low-fat and nonfat yogurt. Fats and oils Tub margarine without trans fats. Light or reduced-fat mayonnaise and salad dressings. Avocado. Olive, canola, sesame, or safflower oils. The items listed above may not be a complete list of foods and beverages you can eat. Contact a dietitian for more information. What foods   should I avoid? Fruits Canned fruit in heavy syrup. Fruit in cream or butter sauce. Fried fruit. Vegetables Vegetables cooked in cheese, cream, or butter sauce. Fried vegetables. Grains White bread. White pasta. White rice. Cornbread. Bagels, pastries,  and croissants. Crackers and snack foods that contain trans fat and hydrogenated oils. Meats and other protein foods Fatty cuts of meat. Ribs, chicken wings, bacon, sausage, bologna, salami, chitterlings, fatback, hot dogs, bratwurst, and packaged lunch meats. Liver and organ meats. Whole eggs and egg yolks. Chicken and turkey with skin. Fried meat. Dairy Whole or 2% milk, cream, half-and-half, and cream cheese. Whole milk cheeses. Whole-fat or sweetened yogurt. Full-fat cheeses. Nondairy creamers and whipped toppings. Processed cheese, cheese spreads, and cheese curds. Fats and oils Butter, stick margarine, lard, shortening, ghee, or bacon fat. Coconut, palm kernel, and palm oils. Beverages Alcohol. Sugar-sweetened drinks such as sodas, lemonade, and fruit drinks. Sweets and desserts Corn syrup, sugars, honey, and molasses. Candy. Jam and jelly. Syrup. Sweetened cereals. Cookies, pies, cakes, donuts, muffins, and ice cream. The items listed above may not be a complete list of foods and beverages you should avoid. Contact a dietitian for more information. Summary Choosing the right foods helps keep your fat and cholesterol at normal levels. This can keep you from getting certain diseases. At meals, fill one-half of your plate with vegetables, green salads, and fruits. Eat high fiber foods, like whole grains, beans, apples, pears, berries, carrots, peas, and barley. Limit added sugar, saturated fats, alcohol, and fried foods. This information is not intended to replace advice given to you by your health care provider. Make sure you discuss any questions you have with your health care provider. Document Revised: 11/16/2020 Document Reviewed: 11/16/2020 Elsevier Patient Education  2022 [employer]  

## 2021-10-14 NOTE — Progress Notes (Signed)
Waiting for vitamin d levels to come back. Overall, labs good.

## 2021-10-22 LAB — COMPREHENSIVE METABOLIC PANEL
ALT: 31 IU/L (ref 0–32)
AST: 27 IU/L (ref 0–40)
Albumin/Globulin Ratio: 1.7 (ref 1.2–2.2)
Albumin: 4.7 g/dL (ref 3.8–4.9)
Alkaline Phosphatase: 83 IU/L (ref 44–121)
BUN/Creatinine Ratio: 30 — ABNORMAL HIGH (ref 12–28)
BUN: 26 mg/dL (ref 8–27)
Bilirubin Total: 0.2 mg/dL (ref 0.0–1.2)
CO2: 22 mmol/L (ref 20–29)
Calcium: 9.9 mg/dL (ref 8.7–10.3)
Chloride: 99 mmol/L (ref 96–106)
Creatinine, Ser: 0.86 mg/dL (ref 0.57–1.00)
Globulin, Total: 2.8 g/dL (ref 1.5–4.5)
Glucose: 101 mg/dL — ABNORMAL HIGH (ref 70–99)
Potassium: 4.6 mmol/L (ref 3.5–5.2)
Sodium: 137 mmol/L (ref 134–144)
Total Protein: 7.5 g/dL (ref 6.0–8.5)
eGFR: 77 mL/min/{1.73_m2} (ref >59)

## 2021-10-22 LAB — CBC WITH DIFFERENTIAL/PLATELET
Basophils Absolute: 0.1 10*3/uL (ref 0.0–0.2)
Basos: 1 %
EOS (ABSOLUTE): 0.3 10*3/uL (ref 0.0–0.4)
Eos: 4 %
Hematocrit: 43.5 % (ref 34.0–46.6)
Hemoglobin: 14.4 g/dL (ref 11.1–15.9)
Immature Grans (Abs): 0 10*3/uL (ref 0.0–0.1)
Immature Granulocytes: 0 %
Lymphocytes Absolute: 2.2 10*3/uL (ref 0.7–3.1)
Lymphs: 30 %
MCH: 30.6 pg (ref 26.6–33.0)
MCHC: 33.1 g/dL (ref 31.5–35.7)
MCV: 92 fL (ref 79–97)
Monocytes Absolute: 0.5 10*3/uL (ref 0.1–0.9)
Monocytes: 7 %
Neutrophils Absolute: 4.5 10*3/uL (ref 1.4–7.0)
Neutrophils: 58 %
Platelets: 289 10*3/uL (ref 150–450)
RBC: 4.71 x10E6/uL (ref 3.77–5.28)
RDW: 12.4 % (ref 11.7–15.4)
WBC: 7.6 10*3/uL (ref 3.4–10.8)

## 2021-10-22 LAB — LIPID PANEL
Chol/HDL Ratio: 3.6 ratio (ref 0.0–4.4)
Cholesterol, Total: 221 mg/dL — ABNORMAL HIGH (ref 100–199)
HDL: 62 mg/dL (ref >39)
LDL Chol Calc (NIH): 133 mg/dL — ABNORMAL HIGH (ref 0–99)
Triglycerides: 145 mg/dL (ref 0–149)
VLDL Cholesterol Cal: 26 mg/dL (ref 5–40)

## 2021-10-22 LAB — VITAMIN D 1,25 DIHYDROXY
Vitamin D 1, 25 (OH)2 Total: 58 pg/mL
Vitamin D2 1, 25 (OH)2: <10 pg/mL
Vitamin D3 1, 25 (OH)2: 55 pg/mL

## 2021-10-22 LAB — TSH: TSH: 1.98 u[IU]/mL (ref 0.450–4.500)

## 2021-10-22 LAB — T4, FREE: Free T4: 1.05 ng/dL (ref 0.82–1.77)

## 2021-11-17 ENCOUNTER — Encounter: Payer: Self-pay | Admitting: Nurse Practitioner

## 2022-02-10 ENCOUNTER — Ambulatory Visit: Payer: Managed Care, Other (non HMO) | Admitting: Nurse Practitioner

## 2022-02-11 ENCOUNTER — Ambulatory Visit: Payer: Managed Care, Other (non HMO) | Admitting: Nurse Practitioner

## 2022-02-24 ENCOUNTER — Other Ambulatory Visit: Payer: Self-pay | Admitting: Nurse Practitioner

## 2022-02-24 DIAGNOSIS — F411 Generalized anxiety disorder: Secondary | ICD-10-CM

## 2022-02-25 ENCOUNTER — Ambulatory Visit (INDEPENDENT_AMBULATORY_CARE_PROVIDER_SITE_OTHER): Payer: Managed Care, Other (non HMO) | Admitting: Nurse Practitioner

## 2022-02-25 ENCOUNTER — Encounter: Payer: Self-pay | Admitting: Nurse Practitioner

## 2022-02-25 VITALS — BP 116/73 | HR 83 | Ht 64.96 in | Wt 173.4 lb

## 2022-02-25 DIAGNOSIS — F411 Generalized anxiety disorder: Secondary | ICD-10-CM | POA: Diagnosis not present

## 2022-02-25 DIAGNOSIS — Z6828 Body mass index (BMI) 28.0-28.9, adult: Secondary | ICD-10-CM | POA: Diagnosis not present

## 2022-02-25 MED ORDER — SERTRALINE HCL 100 MG PO TABS: 100.0000 mg | ORAL_TABLET | Freq: Every day | ORAL | 1 refills | Status: DC

## 2022-02-25 NOTE — Progress Notes (Signed)
Established patient visit   Patient: Melinda Palmer   DOB: June 17, 1961   61 y.o. Female  MRN: [id number] Visit Date: 02/25/2022   Chief Complaint  Patient presents with   Follow-up   Subjective    HPI  Follow up visit -generalized anxiety - takes sertraline 100 mg everyday -does have as needed dose of alprazolam which she can take if needed.  States that she doesn't[t remember the last time she needed to take an as needed alprazolam.  -states that she feels good.  -has no new concerns or complaints today    Medications: Outpatient Medications Prior to Visit  Medication Sig   ALPRAZolam (XANAX) 0.5 MG tablet Take 1 tablet (0.5 mg total) by mouth at bedtime as needed for anxiety.   [DISCONTINUED] sertraline (ZOLOFT) 100 MG tablet Take 1 tablet (100 mg total) by mouth daily.   benzonatate (TESSALON) 200 MG capsule TK 1 C PO TID PRF COUGH   meloxicam (MOBIC) 15 MG tablet Take 1 tablet by mouth daily.   [DISCONTINUED] buPROPion (WELLBUTRIN) 75 MG tablet    No facility-administered medications prior to visit.    Review of Systems  Constitutional:  Negative for activity change, appetite change, chills, fatigue and fever.  HENT:  Negative for congestion, postnasal drip, rhinorrhea, sinus pressure, sinus pain, sneezing and sore throat.   Eyes: Negative.   Respiratory:  Negative for cough, chest tightness, shortness of breath and wheezing.   Cardiovascular:  Negative for chest pain and palpitations.  Gastrointestinal:  Negative for abdominal pain, constipation, diarrhea, nausea and vomiting.  Endocrine: Negative for cold intolerance, heat intolerance, polydipsia and polyuria.  Genitourinary:  Negative for dyspareunia, dysuria, flank pain, frequency and urgency.  Musculoskeletal:  Negative for arthralgias, back pain and myalgias.  Skin:  Negative for rash.  Allergic/Immunologic: Negative for environmental allergies.  Neurological:  Negative for dizziness, weakness and headaches.   Hematological:  Negative for adenopathy.  Psychiatric/Behavioral:  Positive for dysphoric mood. The patient is not nervous/anxious.        Well managed with current dose sertraline     Last CBC Lab Results  Component Value Date   WBC 7.6 10/11/2021   HGB 14.4 10/11/2021   HCT 43.5 10/11/2021   MCV 92 10/11/2021   MCH 30.6 10/11/2021   RDW 12.4 10/11/2021   PLT 289 23/53/6144   Last metabolic panel Lab Results  Component Value Date   GLUCOSE 101 (H) 10/11/2021   NA 137 10/11/2021   K 4.6 10/11/2021   CL 99 10/11/2021   CO2 22 10/11/2021   BUN 26 10/11/2021   CREATININE 0.86 10/11/2021   EGFR 77 10/11/2021   CALCIUM 9.9 10/11/2021   PROT 7.5 10/11/2021   ALBUMIN 4.7 10/11/2021   LABGLOB 2.8 10/11/2021   AGRATIO 1.7 10/11/2021   BILITOT 0.2 10/11/2021   ALKPHOS 83 10/11/2021   AST 27 10/11/2021   ALT 31 10/11/2021   ANIONGAP 10 11/24/2015   Last lipids Lab Results  Component Value Date   CHOL 221 (H) 10/11/2021   HDL 62 10/11/2021   LDLCALC 133 (H) 10/11/2021   TRIG 145 10/11/2021   CHOLHDL 3.6 10/11/2021    Last thyroid functions Lab Results  Component Value Date   TSH 1.980 10/11/2021   T3TOTAL 92 08/23/2018   Last vitamin D Lab Results  Component Value Date   VD25OH 28.4 (L) 08/23/2018       Objective     Today's Vitals   02/25/22 1549  BP: 116/73  Pulse: 83  SpO2: 94%  Weight: 173 lb 6.4 oz (78.7 kg)  Height: 5' 4.96" (1.65 m)   Body mass index is 28.89 kg/m.   BP Readings from Last 3 Encounters:  02/25/22 116/73  10/10/21 101/66  05/15/21 116/75    Wt Readings from Last 3 Encounters:  02/25/22 173 lb 6.4 oz (78.7 kg)  10/10/21 172 lb 12.8 oz (78.4 kg)  05/15/21 174 lb 4.8 oz (79.1 kg)    Physical Exam Vitals and nursing note reviewed.  Constitutional:      Appearance: Normal appearance. She is well-developed.  HENT:     Head: Normocephalic and atraumatic.     Nose: Nose normal.     Mouth/Throat:     Mouth: Mucous  membranes are moist.     Pharynx: Oropharynx is clear.  Eyes:     Extraocular Movements: Extraocular movements intact.     Conjunctiva/sclera: Conjunctivae normal.     Pupils: Pupils are equal, round, and reactive to light.  Cardiovascular:     Rate and Rhythm: Normal rate and regular rhythm.     Pulses: Normal pulses.     Heart sounds: Normal heart sounds.  Pulmonary:     Effort: Pulmonary effort is normal.     Breath sounds: Normal breath sounds.  Abdominal:     Palpations: Abdomen is soft.  Musculoskeletal:        General: Normal range of motion.     Cervical back: Normal range of motion and neck supple.  Lymphadenopathy:     Cervical: No cervical adenopathy.  Skin:    General: Skin is warm and dry.     Capillary Refill: Capillary refill takes less than 2 seconds.  Neurological:     General: No focal deficit present.     Mental Status: She is alert and oriented to person, place, and time.  Psychiatric:        Mood and Affect: Mood normal.        Behavior: Behavior normal.        Thought Content: Thought content normal.        Judgment: Judgment normal.       Assessment & Plan    1. Generalized anxiety disorder Very well managed. Continue sertraline 100 mg daily. New prescription sent to her pharmacy today. May take as needed alprazolam as needed.  - sertraline (ZOLOFT) 100 MG tablet; Take 1 tablet (100 mg total) by mouth daily.  Dispense: 90 tablet; Refill: 1  2. Body mass index 28.0-28.9, adult Discussed lowering calorie intake to 1500 calories per day and incorporating exercise into daily routine to help lose weight.  Problem List Items Addressed This Visit       Other   Generalized anxiety disorder - Primary   Relevant Medications   sertraline (ZOLOFT) 100 MG tablet   Body mass index 28.0-28.9, adult     Return in about 4 months (around 06/27/2022) for mood.         Ronnell Freshwater, NP  [facility] at [facility] [phone]  (phone) [phone] (fax)  [place]

## 2022-06-09 ENCOUNTER — Other Ambulatory Visit: Payer: Self-pay | Admitting: Nurse Practitioner

## 2022-06-09 DIAGNOSIS — F411 Generalized anxiety disorder: Secondary | ICD-10-CM

## 2022-06-26 ENCOUNTER — Ambulatory Visit: Payer: Managed Care, Other (non HMO) | Admitting: Nurse Practitioner

## 2022-07-30 ENCOUNTER — Ambulatory Visit (INDEPENDENT_AMBULATORY_CARE_PROVIDER_SITE_OTHER): Payer: Managed Care, Other (non HMO) | Admitting: Nurse Practitioner

## 2022-07-30 ENCOUNTER — Encounter: Payer: Self-pay | Admitting: Nurse Practitioner

## 2022-07-30 VITALS — BP 133/80 | HR 68 | Resp 18 | Ht 64.96 in | Wt 174.0 lb

## 2022-07-30 DIAGNOSIS — Z0000 Encounter for general adult medical examination without abnormal findings: Secondary | ICD-10-CM | POA: Diagnosis not present

## 2022-07-30 DIAGNOSIS — R5383 Other fatigue: Secondary | ICD-10-CM | POA: Diagnosis not present

## 2022-07-30 DIAGNOSIS — F411 Generalized anxiety disorder: Secondary | ICD-10-CM | POA: Diagnosis not present

## 2022-07-30 DIAGNOSIS — E559 Vitamin D deficiency, unspecified: Secondary | ICD-10-CM

## 2022-07-30 MED ORDER — SERTRALINE HCL 100 MG PO TABS: 100.0000 mg | ORAL_TABLET | Freq: Every day | ORAL | 1 refills | Status: DC

## 2022-07-30 MED ORDER — ALPRAZOLAM 0.5 MG PO TABS: 0.5000 mg | ORAL_TABLET | Freq: Every evening | ORAL | 3 refills | Status: DC | PRN

## 2022-07-30 NOTE — Progress Notes (Signed)
Established patient visit   Patient: Melinda Palmer   DOB: October 25, 1960   62 y.o. Female  MRN: [id number] Visit Date: 07/30/2022   Chief Complaint  Patient presents with   Follow-up   Anxiety   Subjective    HPI  Follow up  -GAD --takes sertraline daily --has as needed prescription for alprazolam but has not needed for some time. Current prescription expired.   -She denies chest pain, chest pressure, or shortness of breath. She denies headaches or visual disturbances. She denies abdominal pain, nausea, vomiting, or changes in bowel or bladder habits.     Medications: Outpatient Medications Prior to Visit  Medication Sig   [DISCONTINUED] ALPRAZolam (XANAX) 0.5 MG tablet Take 1 tablet (0.5 mg total) by mouth at bedtime as needed for anxiety.   [DISCONTINUED] sertraline (ZOLOFT) 100 MG tablet Take 1 tablet (100 mg total) by mouth daily.   benzonatate (TESSALON) 200 MG capsule TK 1 C PO TID PRF COUGH   meloxicam (MOBIC) 15 MG tablet Take 1 tablet by mouth daily.   No facility-administered medications prior to visit.    Review of Systems  Constitutional:  Negative for activity change, appetite change, chills, fatigue and fever.  HENT:  Negative for congestion, postnasal drip, rhinorrhea, sinus pressure, sinus pain, sneezing and sore throat.   Eyes: Negative.   Respiratory:  Negative for cough, chest tightness, shortness of breath and wheezing.   Cardiovascular:  Negative for chest pain and palpitations.  Gastrointestinal:  Negative for abdominal pain, constipation, diarrhea, nausea and vomiting.  Endocrine: Negative for cold intolerance, heat intolerance, polydipsia and polyuria.  Genitourinary:  Negative for dyspareunia, dysuria, flank pain, frequency and urgency.  Musculoskeletal:  Negative for arthralgias, back pain and myalgias.  Skin:  Negative for rash.  Allergic/Immunologic: Negative for environmental allergies.  Neurological:  Negative for dizziness, weakness and  headaches.  Hematological:  Negative for adenopathy.  Psychiatric/Behavioral:  Positive for dysphoric mood. The patient is nervous/anxious.        Well managed with current medicatoion    Last CBC Lab Results  Component Value Date   WBC 7.6 10/11/2021   HGB 14.4 10/11/2021   HCT 43.5 10/11/2021   MCV 92 10/11/2021   MCH 30.6 10/11/2021   RDW 12.4 10/11/2021   PLT 289 [id number]   Last metabolic panel Lab Results  Component Value Date   GLUCOSE 101 (H) 10/11/2021   NA 137 10/11/2021   K 4.6 10/11/2021   CL 99 10/11/2021   CO2 22 10/11/2021   BUN 26 10/11/2021   CREATININE 0.86 10/11/2021   EGFR 77 10/11/2021   CALCIUM 9.9 10/11/2021   PROT 7.5 10/11/2021   ALBUMIN 4.7 10/11/2021   LABGLOB 2.8 10/11/2021   AGRATIO 1.7 10/11/2021   BILITOT 0.2 10/11/2021   ALKPHOS 83 10/11/2021   AST 27 10/11/2021   ALT 31 10/11/2021   ANIONGAP 10 11/24/2015   Last lipids Lab Results  Component Value Date   CHOL 221 (H) 10/11/2021   HDL 62 10/11/2021   LDLCALC 133 (H) 10/11/2021   TRIG 145 10/11/2021   CHOLHDL 3.6 10/11/2021   Last hemoglobin A1c No results found for: "HGBA1C" Last thyroid functions Lab Results  Component Value Date   TSH 1.980 10/11/2021   T3TOTAL 92 08/23/2018   Last vitamin D Lab Results  Component Value Date   VD25OH 28.4 (L) 08/23/2018       Objective     Today's Vitals   07/30/22 1540  BP: 133/80  Pulse:  68  Resp: 18  SpO2: 95%  Weight: 174 lb (78.9 kg)  Height: 5' 4.96" (1.65 m)   Body mass index is 28.99 kg/m.  BP Readings from Last 3 Encounters:  07/30/22 133/80  02/25/22 116/73  10/10/21 101/66    Wt Readings from Last 3 Encounters:  07/30/22 174 lb (78.9 kg)  02/25/22 173 lb 6.4 oz (78.7 kg)  10/10/21 172 lb 12.8 oz (78.4 kg)    Physical Exam Vitals and nursing note reviewed.  Constitutional:      Appearance: Normal appearance. She is well-developed.  HENT:     Head: Normocephalic and atraumatic.     Nose: Nose  normal.     Mouth/Throat:     Mouth: Mucous membranes are moist.     Pharynx: Oropharynx is clear.  Eyes:     Extraocular Movements: Extraocular movements intact.     Conjunctiva/sclera: Conjunctivae normal.     Pupils: Pupils are equal, round, and reactive to light.  Cardiovascular:     Rate and Rhythm: Normal rate and regular rhythm.     Pulses: Normal pulses.     Heart sounds: Normal heart sounds.  Pulmonary:     Effort: Pulmonary effort is normal.     Breath sounds: Normal breath sounds.  Abdominal:     Palpations: Abdomen is soft.  Musculoskeletal:        General: Normal range of motion.     Cervical back: Normal range of motion and neck supple.  Lymphadenopathy:     Cervical: No cervical adenopathy.  Skin:    General: Skin is warm and dry.     Capillary Refill: Capillary refill takes less than 2 seconds.  Neurological:     General: No focal deficit present.     Mental Status: She is alert and oriented to person, place, and time.  Psychiatric:        Mood and Affect: Mood normal.        Behavior: Behavior normal.        Thought Content: Thought content normal.        Judgment: Judgment normal.      Assessment & Plan    1. Generalized anxiety disorder Well managed. Continue sertraline 100 ,mg daily. May take alprazolam 0.5 mg at bedtime if needed. A new prescription for both were sent to her pharmacy today  - ALPRAZolam (XANAX) 0.5 MG tablet; Take 1 tablet (0.5 mg total) by mouth at bedtime as needed for anxiety.  Dispense: 30 tablet; Refill: 3 - sertraline (ZOLOFT) 100 MG tablet; Take 1 tablet (100 mg total) by mouth daily.  Dispense: 90 tablet; Refill: 1  2. Other fatigue Check thyroid panel and HgbA1c with routine, fasting labs  - TSH + free T4 - Hemoglobin A1c  3. Vitamin D deficiency Check vitamin d level and treat deficiency as indicated.   - VITAMIN D 25 Hydroxy (Vit-D Deficiency, Fractures)  4. Healthcare maintenance Routine, fasting labs ordered during  today's visit.  - Hemoglobin A1c - Lipid panel - Comprehensive metabolic panel - CBC    Problem List Items Addressed This Visit       Other   Other fatigue   Relevant Orders   TSH + free T4   Hemoglobin A1c   Generalized anxiety disorder - Primary   Relevant Medications   ALPRAZolam (XANAX) 0.5 MG tablet   sertraline (ZOLOFT) 100 MG tablet   Vitamin D deficiency   Relevant Orders   VITAMIN D 25 Hydroxy (Vit-D Deficiency, Fractures)  Other Visit Diagnoses     Healthcare maintenance       Relevant Orders   Hemoglobin A1c   Lipid panel   Comprehensive metabolic panel   CBC        Return in about 6 months (around 01/28/2023) for health maintenance exam.         Ronnell Freshwater, NP  [place] [facility] [phone] (phone) [phone] (fax)  [place]

## 2022-10-02 LAB — LIPID PANEL
Chol/HDL Ratio: 3 ratio (ref 0.0–4.4)
Cholesterol, Total: 218 mg/dL — ABNORMAL HIGH (ref 100–199)
HDL: 73 mg/dL (ref >39)
LDL Chol Calc (NIH): 123 mg/dL — ABNORMAL HIGH (ref 0–99)
Triglycerides: 129 mg/dL (ref 0–149)
VLDL Cholesterol Cal: 22 mg/dL (ref 5–40)

## 2022-10-02 LAB — COMPREHENSIVE METABOLIC PANEL
ALT: 26 IU/L (ref 0–32)
AST: 23 IU/L (ref 0–40)
Albumin/Globulin Ratio: 1.9 (ref 1.2–2.2)
Albumin: 4.7 g/dL (ref 3.9–4.9)
Alkaline Phosphatase: 80 IU/L (ref 44–121)
BUN/Creatinine Ratio: 16 (ref 12–28)
BUN: 14 mg/dL (ref 8–27)
Bilirubin Total: 0.2 mg/dL (ref 0.0–1.2)
CO2: 22 mmol/L (ref 20–29)
Calcium: 10.1 mg/dL (ref 8.7–10.3)
Chloride: 101 mmol/L (ref 96–106)
Creatinine, Ser: 0.85 mg/dL (ref 0.57–1.00)
Globulin, Total: 2.5 g/dL (ref 1.5–4.5)
Glucose: 88 mg/dL (ref 70–99)
Potassium: 4.2 mmol/L (ref 3.5–5.2)
Sodium: 140 mmol/L (ref 134–144)
Total Protein: 7.2 g/dL (ref 6.0–8.5)
eGFR: 78 mL/min/{1.73_m2} (ref >59)

## 2022-10-02 LAB — CBC
Hematocrit: 39.3 % (ref 34.0–46.6)
Hemoglobin: 13.8 g/dL (ref 11.1–15.9)
MCH: 31.7 pg (ref 26.6–33.0)
MCHC: 35.1 g/dL (ref 31.5–35.7)
MCV: 90 fL (ref 79–97)
Platelets: 245 10*3/uL (ref 150–450)
RBC: 4.36 x10E6/uL (ref 3.77–5.28)
RDW: 12.1 % (ref 11.7–15.4)
WBC: 7.9 10*3/uL (ref 3.4–10.8)

## 2022-10-02 LAB — TSH+FREE T4
Free T4: 1.08 ng/dL (ref 0.82–1.77)
TSH: 2.21 u[IU]/mL (ref 0.450–4.500)

## 2022-10-02 LAB — VITAMIN D 25 HYDROXY (VIT D DEFICIENCY, FRACTURES): Vit D, 25-Hydroxy: 37.4 ng/mL (ref 30.0–100.0)

## 2022-10-02 LAB — HEMOGLOBIN A1C
Est. average glucose Bld gHb Est-mCnc: 114 mg/dL
Hgb A1c MFr Bld: 5.6 % (ref 4.8–5.6)

## 2022-12-17 ENCOUNTER — Other Ambulatory Visit: Payer: Self-pay | Admitting: Nurse Practitioner

## 2022-12-17 DIAGNOSIS — F411 Generalized anxiety disorder: Secondary | ICD-10-CM

## 2023-01-19 NOTE — Progress Notes (Signed)
Mild elevation of LDL and total cholesterol.  Patient notified of results.

## 2023-02-04 ENCOUNTER — Encounter: Payer: Managed Care, Other (non HMO) | Admitting: Nurse Practitioner

## 2023-02-16 ENCOUNTER — Encounter: Payer: Managed Care, Other (non HMO) | Admitting: Family Medicine

## 2023-03-13 ENCOUNTER — Telehealth: Payer: Managed Care, Other (non HMO) | Admitting: Physician Assistant

## 2023-03-13 DIAGNOSIS — U071 COVID-19: Secondary | ICD-10-CM | POA: Diagnosis not present

## 2023-03-13 MED ORDER — FLUTICASONE PROPIONATE 50 MCG/ACT NA SUSP: 2.0000 | Freq: Every day | NASAL | 0 refills | Status: DC

## 2023-03-13 MED ORDER — NAPROXEN 500 MG PO TABS: 500.0000 mg | ORAL_TABLET | Freq: Two times a day (BID) | ORAL | 0 refills | Status: DC

## 2023-03-13 MED ORDER — PROMETHAZINE-DM 6.25-15 MG/5ML PO SYRP: 5.0000 mL | ORAL_SOLUTION | Freq: Four times a day (QID) | ORAL | 0 refills | Status: DC | PRN

## 2023-03-13 NOTE — Progress Notes (Signed)
E-Visit  for Positive Covid Test Result   We are sorry you are not feeling well. We are here to help!  You have tested positive for COVID-19, meaning that you were infected with the novel coronavirus and could give the virus to others.  Most people with COVID-19 have mild illness and can recover at home without medical care. Do not leave your home, except to get medical care. Do not visit public areas and do not go to places where you are unable to wear a mask. It is important that you stay home  to take care for yourself and to help protect other people in your home and community.      Isolation Instructions:   You are to isolate at home until you have been fever free for at least 24 hours without a fever-reducing medication, and symptoms have been steadily improving for 24 hours. At that time,  you can end isolation but need to mask for an additional 5 days.  If you must be around other household members who do not have symptoms, you need to make sure that both you and the family members are masking consistently with a high-quality mask.  If you note any worsening of symptoms despite treatment, please seek an in-person evaluation ASAP. If you note any significant shortness of breath or any chest pain, please seek ER evaluation. Please do not delay care!   Go to the nearest hospital ED for assessment if fever/cough/breathlessness are severe or illness seems like a threat to life.    The following symptoms may appear 2-14 days after exposure: Fever Cough Shortness of breath or difficulty breathing Chills Repeated shaking with chills Muscle pain Headache Sore throat New loss of taste or smell Fatigue Congestion or runny nose Nausea or vomiting Diarrhea  You can use medication such as prescription cough medication called Phenergan DM 6.25 mg/15 mg. You make take one teaspoon / 5 ml every 4-6 hours as needed for cough, prescription anti-inflammatory called Naprosyn 500 mg. Take twice  daily as needed for fever or body aches for 2 weeks, and prescription for Fluticasone nasal spray 2 sprays in each nostril one time per day  You may also take acetaminophen (Tylenol) as needed for fever.  HOME CARE: Only take medications as instructed by your medical team. Drink plenty of fluids and get plenty of rest. A steam or ultrasonic humidifier can help if you have congestion.   GET HELP RIGHT AWAY IF YOU HAVE EMERGENCY WARNING SIGNS.  Call 911 or proceed to your closest emergency facility if: You develop worsening high fever. Trouble breathing Bluish lips or face Persistent pain or pressure in the chest New confusion Inability to wake or stay awake You cough up blood. Your symptoms become more severe Inability to hold down food or fluids  This list is not all possible symptoms. Contact your medical provider for any symptoms that are severe or concerning to you.   Your e-visit answers were reviewed by a board certified advanced clinical practitioner to complete your personal care plan.  Depending on the condition, your plan could have included both over the counter or prescription medications.  If there is a problem please reply once you have received a response from your provider.  Your safety is important to [place].  If you have drug allergies check your prescription carefully.    You can use MyChart to ask questions about today's visit, request a non-urgent call back, or ask for a work or school excuse  for 24 hours related to this e-Visit. If it has been greater than 24 hours you will need to follow up with your provider, or enter a new e-Visit to address those concerns. You will get an e-mail in the next two days asking about your experience.  I hope that your e-visit has been valuable and will speed your recovery. Thank you for using e-visits.  I have spent 5 minutes in review of e-visit questionnaire, review and updating patient chart, medical decision making and response to  patient.   Mar Daring, PA-C

## 2023-04-20 ENCOUNTER — Encounter: Payer: Self-pay | Admitting: Family Medicine

## 2023-04-20 ENCOUNTER — Ambulatory Visit (INDEPENDENT_AMBULATORY_CARE_PROVIDER_SITE_OTHER): Payer: Managed Care, Other (non HMO) | Admitting: Family Medicine

## 2023-04-20 VITALS — BP 133/81 | HR 77 | Ht 64.96 in | Wt 169.8 lb

## 2023-04-20 DIAGNOSIS — F329 Major depressive disorder, single episode, unspecified: Secondary | ICD-10-CM | POA: Diagnosis not present

## 2023-04-20 DIAGNOSIS — Z0000 Encounter for general adult medical examination without abnormal findings: Secondary | ICD-10-CM

## 2023-04-20 NOTE — Assessment & Plan Note (Signed)
Continue current blood 100 mg.  Follow-up 6 months

## 2023-04-20 NOTE — Assessment & Plan Note (Signed)
Overall healthy.  Discussed her mildly elevated LDL.  Patient is using weight watchers as a diet routine and exercises regularly.  Patient is due for Pap smear in 2 years.  Currently due for mammogram.  Recommended patient reach out to her imaging location and schedule a mammogram appointment.

## 2023-04-20 NOTE — Progress Notes (Signed)
   Annual physical  Subjective   Patient ID: Melinda Palmer, female    DOB: 1960-09-02  Age: 62 y.o. MRN: [id number]  Chief Complaint  Patient presents with   Annual Exam   HPI Melinda Palmer is a 62 y.o. old female here  for annual exam.   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  She is in a long-term relationship with a female partner..  She has 1 son who is 43 years old and 2 grandchildren.  She does not use tobacco, drinks a few times per week, no recreational drug use.  Patient exercises by walking daily for 20 to 30 minutes with her dog.  She is currently using weight watchers to help with her diet.  Patient currently postmenopausal.  She is due for a mammogram.  Last Pap smear was 3 years ago recommended to follow-up in 5 years.    Patient takes Zoloft for her depression.  Stable with this.  Has taken it for several years.  She also takes Xanax occasionally at night but has not taken it several weeks or longer.  The 10-year ASCVD risk score (Arnett DK, et al., 2019) is: 3.9%  Health Maintenance Due  Topic Date Due   HIV Screening  Never done   DTaP/Tdap/Td (1 - Tdap) Never done   Zoster Vaccines- Shingrix (1 of 2) Never done   COVID-19 Vaccine (2 - Janssen risk series) 11/23/2019   INFLUENZA VACCINE  Never done      Objective:     BP 133/81   Pulse 77   Ht 5' 4.96" (1.65 m)   Wt 169 lb 12.8 oz (77 kg)   LMP  (LMP Unknown)   SpO2 97%   BMI 28.29 kg/m    Physical Exam General: Alert, oriented HEENT: PERRLA, EOMI, moist mucosa CV: Regular rhythm no murmurs Pulmonary: Clear bilaterally GI: Soft, normal bowel sounds MSK: Strength equal bilaterally Extremities: No pedal edema Psych: Pleasant affect, spontaneous speech congruent contact. Skin: Warm and dry.   No results found for any visits on 04/20/23.      Assessment & Plan:   Major depression, chronic Assessment &  Plan: Continue current blood 100 mg.  Follow-up 6 months   Physical exam, annual Assessment & Plan: Overall healthy.  Discussed her mildly elevated LDL.  Patient is using weight watchers as a diet routine and exercises regularly.  Patient is due for Pap smear in 2 years.  Currently due for mammogram.  Recommended patient reach out to her imaging location and schedule a mammogram appointment.      Return in about 6 months (around 10/18/2023) for mood.    Sandre Kitty, MD

## 2023-04-20 NOTE — Patient Instructions (Addendum)
It was nice to see you today,  We addressed the following topics today: -Your cholesterol 6 months ago was slightly elevated.  Continue to eat a healthy diet low in saturated fat and get exercise regularly.  Try to aim for at least 5 days a week of 20 to 30 minutes of exercise.  We can check it again in 6 months. -You are due for a Pap smear in 2 years.  You are currently due for a mammogram.  You can schedule this with the imaging center who did your previous mammograms  Have a great day,  Frederic Jericho, MD

## 2023-06-17 ENCOUNTER — Telehealth: Payer: Managed Care, Other (non HMO) | Admitting: Physician Assistant

## 2023-06-17 DIAGNOSIS — J069 Acute upper respiratory infection, unspecified: Secondary | ICD-10-CM | POA: Diagnosis not present

## 2023-06-17 MED ORDER — BENZONATATE 100 MG PO CAPS: 100.0000 mg | ORAL_CAPSULE | Freq: Three times a day (TID) | ORAL | 0 refills | Status: DC | PRN

## 2023-06-17 NOTE — Progress Notes (Signed)
E-Visit for Upper Respiratory Infection   We are sorry you are not feeling well.  Here is how we plan to help!  Based on what you have shared with me, it looks like you may have a viral upper respiratory infection.  Upper respiratory infections are caused by a large number of viruses; however, rhinovirus is the most common cause.   Symptoms vary from person to person, with common symptoms including sore throat, cough, fatigue or lack of energy and feeling of general discomfort.  A low-grade fever of up to 100.4 may present, but is often uncommon.  Symptoms vary however, and are closely related to a person's age or underlying illnesses.  The most common symptoms associated with an upper respiratory infection are nasal discharge or congestion, cough, sneezing, headache and pressure in the ears and face.  These symptoms usually persist for about 3 to 10 days, but can last up to 2 weeks.  It is important to know that upper respiratory infections do not cause serious illness or complications in most cases.    Upper respiratory infections can be transmitted from person to person, with the most common method of transmission being a person's hands.  The virus is able to live on the skin and can infect other persons for up to 2 hours after direct contact.  Also, these can be transmitted when someone coughs or sneezes; thus, it is important to cover the mouth to reduce this risk.  To keep the spread of the illness at bay, good hand hygiene is very important.  This is an infection that is most likely caused by a virus. There are no specific treatments other than to help you with the symptoms until the infection runs its course.  We are sorry you are not feeling well.  Here is how we plan to help!  If you have a cough, you may use cough suppressants such as Delsym and Robitussin.  If you have glaucoma or high blood pressure, you can also use Coricidin HBP.   For cough I have prescribed for you A prescription cough  medication called Tessalon Perles 100 mg. You may take 1-2 capsules every 8 hours as needed for cough  If you have a sore or scratchy throat, use a saltwater gargle-  to  teaspoon of salt dissolved in a 4-ounce to 8-ounce glass of warm water.  Gargle the solution for approximately 15-30 seconds and then spit.  It is important not to swallow the solution.  You can also use throat lozenges/cough drops and Chloraseptic spray to help with throat pain or discomfort.  Warm or cold liquids can also be helpful in relieving throat pain.  For headache, pain or general discomfort, you can use Ibuprofen or Tylenol as directed.   Some authorities believe that zinc sprays or the use of Echinacea may shorten the course of your symptoms.   HOME CARE Only take medications as instructed by your medical team. Be sure to drink plenty of fluids. Water is fine as well as fruit juices, sodas and electrolyte beverages. You may want to stay away from caffeine or alcohol. If you are nauseated, try taking small sips of liquids. How do you know if you are getting enough fluid? Your urine should be a pale yellow or almost colorless. Get rest. Taking a steamy shower or using a humidifier may help nasal congestion and ease sore throat pain. You can place a towel over your head and breathe in the steam from hot water coming  from a faucet. Using a saline nasal spray works much the same way. Cough drops, hard candies and sore throat lozenges may ease your cough. Avoid close contacts especially the very young and the elderly Cover your mouth if you cough or sneeze Always remember to wash your hands.   GET HELP RIGHT AWAY IF: You develop worsening fever. If your symptoms do not improve within 10 days You develop yellow or green discharge from your nose over 3 days. You have coughing fits You develop a severe head ache or visual changes. You develop shortness of breath, difficulty breathing or start having chest pain Your  symptoms persist after you have completed your treatment plan  MAKE SURE YOU  Understand these instructions. Will watch your condition. Will get help right away if you are not doing well or get worse.  Thank you for choosing an e-visit.  Your e-visit answers were reviewed by a board certified advanced clinical practitioner to complete your personal care plan. Depending upon the condition, your plan could have included both over the counter or prescription medications.  Please review your pharmacy choice. Make sure the pharmacy is open so you can pick up prescription now. If there is a problem, you may contact your provider through [employer] and have the prescription routed to another pharmacy.  Your safety is important to [place]. If you have drug allergies check your prescription carefully.   For the next 24 hours you can use MyChart to ask questions about today's visit, request a non-urgent call back, or ask for a work or school excuse. You will get an email in the next two days asking about your experience. I hope that your e-visit has been valuable and will speed your recovery.

## 2023-06-17 NOTE — Progress Notes (Signed)
Message sent to patient requesting further input regarding current symptoms. Awaiting patient response.  

## 2023-06-17 NOTE — Progress Notes (Signed)
I have spent 5 minutes in review of e-visit questionnaire, review and updating patient chart, medical decision making and response to patient.   Mia Milan Cody Jacklynn Dehaas, PA-C    

## 2023-10-19 ENCOUNTER — Ambulatory Visit: Payer: Managed Care, Other (non HMO) | Admitting: Family Medicine

## 2023-10-31 ENCOUNTER — Other Ambulatory Visit: Payer: Self-pay | Admitting: Nurse Practitioner

## 2023-10-31 DIAGNOSIS — F411 Generalized anxiety disorder: Secondary | ICD-10-CM

## 2023-12-04 ENCOUNTER — Ambulatory Visit: Admitting: Family Medicine

## 2024-01-26 ENCOUNTER — Ambulatory Visit: Admitting: Family Medicine

## 2024-03-16 ENCOUNTER — Encounter: Payer: Self-pay | Admitting: Family Medicine

## 2024-03-16 ENCOUNTER — Ambulatory Visit: Admitting: Family Medicine

## 2024-03-16 VITALS — BP 114/76 | HR 105 | Ht 64.0 in | Wt 150.0 lb

## 2024-03-16 DIAGNOSIS — R202 Paresthesia of skin: Secondary | ICD-10-CM | POA: Diagnosis not present

## 2024-03-16 DIAGNOSIS — Z23 Encounter for immunization: Secondary | ICD-10-CM | POA: Diagnosis not present

## 2024-03-16 DIAGNOSIS — F411 Generalized anxiety disorder: Secondary | ICD-10-CM | POA: Diagnosis not present

## 2024-03-16 DIAGNOSIS — Z1329 Encounter for screening for other suspected endocrine disorder: Secondary | ICD-10-CM

## 2024-03-16 DIAGNOSIS — Z1231 Encounter for screening mammogram for malignant neoplasm of breast: Secondary | ICD-10-CM

## 2024-03-16 DIAGNOSIS — F330 Major depressive disorder, recurrent, mild: Secondary | ICD-10-CM | POA: Diagnosis not present

## 2024-03-16 DIAGNOSIS — F410 Panic disorder [episodic paroxysmal anxiety] without agoraphobia: Secondary | ICD-10-CM | POA: Diagnosis not present

## 2024-03-16 DIAGNOSIS — E559 Vitamin D deficiency, unspecified: Secondary | ICD-10-CM

## 2024-03-16 DIAGNOSIS — F419 Anxiety disorder, unspecified: Secondary | ICD-10-CM

## 2024-03-16 DIAGNOSIS — Z136 Encounter for screening for cardiovascular disorders: Secondary | ICD-10-CM

## 2024-03-16 DIAGNOSIS — N182 Chronic kidney disease, stage 2 (mild): Secondary | ICD-10-CM

## 2024-03-16 DIAGNOSIS — Z7689 Persons encountering health services in other specified circumstances: Secondary | ICD-10-CM

## 2024-03-16 MED ORDER — SERTRALINE HCL 100 MG PO TABS: 100.0000 mg | ORAL_TABLET | Freq: Every day | ORAL | 3 refills | Status: AC

## 2024-03-16 NOTE — Progress Notes (Signed)
 Transition of Care patient visit   Patient: Melinda Palmer   DOB: 09-18-1960   63 y.o. Female  MRN: [id number] Visit Date: 03/16/2024  Today's healthcare provider: LAURAINE LOISE BUOY, DO   Chief Complaint  Patient presents with   New Patient (Initial Visit)    No concerns agree to tdap vaccines denied the other 2    Subjective    Melinda Palmer is a 63 y.o. female who presents today as a new patient to establish care.  HPI HPI     New Patient (Initial Visit)    Additional comments: No concerns agree to tdap vaccines denied the other 2       Last edited by Thelbert Eulalio CHRISTELLA, CMA on 03/16/2024  9:14 AM.       Last annual exam: 04/20/2023 Due for pap: 11/17/2024; last NILM, HPV negative   Melinda Palmer is a 63 year old female who presents to establish care.  She prefers a female provider and currently has no health concerns. She is not interested in receiving the shingles or pneumonia vaccines at this time. She is aware of her grandmother's history of shingles.  She is taking sertraline  100 mg daily and uses Xanax  as needed, though not daily.  She has had the same prescription of Xanax  0.5 mg, 30 tablets, since January 2024 and does not remember when she last took 1.  She reports that her anxiety is fairly well controlled and that she does not currently experience significant depression. She has a history of depression following her father's death in the 1990s, which was managed with sertraline . She fears discontinuing sertraline  due to past experiences of depression.  She has a history of sciatica.   She describes the pain from sciatica as 'worse than giving birth.' No other numbness or tingling is present, and she denies any chronic fatigue beyond what she considers normal for her age.  She has lost approximately 30 pounds, reducing her weight from 175 pounds, and maintains a healthy lifestyle. She denies any issues with blood pressure, cholesterol, or blood sugar levels. She  reports no history of abnormal mammograms.  She does endorse persistent numbness in her right foot.    Past Medical History:  Diagnosis Date   Anxiety    Depression    Migraines    Past Surgical History:  Procedure Laterality Date   child birth natural  [birth year]   COLONOSCOPY WITH PROPOFOL  N/A 09/07/2018   Procedure: COLONOSCOPY WITH PROPOFOL ;  Surgeon: Jinny Carmine, MD;  Location: [facility] ENDOSCOPY;  Service: Endoscopy;  Laterality: N/A;   Family Status  Relation Name Status   Mother  Deceased   Father Kani Jobson Deceased   [employer] (Not Specified)   PGM Grandmother (Not Specified)   Neg Hx  (Not Specified)  No partnership data on file   Family History  Problem Relation Age of Onset   Cancer Father    Heart attack Father    Anxiety disorder Father    Depression Father    Heart disease Father    Stroke Maternal Grandmother    Diabetes Maternal Grandmother    Obesity Maternal Grandmother    Diabetes Paternal Grandmother    Hypertension Neg Hx    Hyperlipidemia Neg Hx    Lymphoma Neg Hx    Migraines Neg Hx    Glaucoma Neg Hx    Social History   Socioeconomic History   Marital status: Single    Spouse name: Not on  file   Number of children: Not on file   Years of education: Not on file   Highest education level: 12th grade  Occupational History   Not on file  Tobacco Use   Smoking status: Never   Smokeless tobacco: Never  Vaping Use   Vaping status: Never Used  Substance and Sexual Activity   Alcohol use: Yes    Comment: ocassionally    Drug use: No   Sexual activity: Yes    Partners: Male  Other Topics Concern   Not on file  Social History Narrative   Not on file   Social Drivers of Health   Financial Resource Strain: Low Risk  (03/14/2024)   Overall Financial Resource Strain (CARDIA)    Difficulty of Paying Living Expenses: Not hard at all  Food Insecurity: No Food Insecurity (03/14/2024)   Hunger Vital Sign    Worried About Running Out  of Food in the Last Year: Never true    Ran Out of Food in the Last Year: Never true  Transportation Needs: No Transportation Needs (03/14/2024)   PRAPARE - Administrator, Civil Service (Medical): No    Lack of Transportation (Non-Medical): No  Physical Activity: Inactive (03/14/2024)   Exercise Vital Sign    Days of Exercise per Week: 0 days    Minutes of Exercise per Session: Not on file  Stress: No Stress Concern Present (03/14/2024)   [employer] of Occupational Health - Occupational Stress Questionnaire    Feeling of Stress: Only a little  Social Connections: Moderately Integrated (03/14/2024)   Social Connection and Isolation Panel    Frequency of Communication with Friends and Family: More than three times a week    Frequency of Social Gatherings with Friends and Family: Once a week    Attends Religious Services: 1 to 4 times per year    Active Member of [employer] or Organizations: No    Attends Engineer, structural: Not on file    Marital Status: Living with partner   Outpatient Medications Prior to Visit  Medication Sig   ALPRAZolam  (XANAX ) 0.5 MG tablet Take 1 tablet (0.5 mg total) by mouth at bedtime as needed for anxiety.   [DISCONTINUED] benzonatate  (TESSALON ) 100 MG capsule Take 1 capsule (100 mg total) by mouth 3 (three) times daily as needed for cough.   [DISCONTINUED] sertraline  (ZOLOFT ) 100 MG tablet TAKE 1 TABLET BY MOUTH DAILY   No facility-administered medications prior to visit.   Allergies  Allergen Reactions   Penicillin G Rash and Swelling   Penicillins     Immunization History  Administered Date(s) Administered   Janssen (J&J) SARS-COV-2 Vaccination 10/26/2019   Tdap 03/16/2024    Health Maintenance  Topic Date Due   MAMMOGRAM  05/25/2023   Zoster Vaccines- Shingrix (1 of 2) 06/16/2024 (Originally [birth year]/07/1980)   INFLUENZA VACCINE  10/18/2024 (Originally 02/19/2024)   COVID-19 Vaccine (2 - Janssen risk series) 03/13/2025  (Originally 11/23/2019)   Pneumococcal Vaccine: 50+ Years (1 of 1 - PCV) 03/16/2025 (Originally 03/08/2011)   Cervical Cancer Screening (HPV/Pap Cotest)  11/17/2024   Colonoscopy  09/07/2028   DTaP/Tdap/Td (2 - Td or Tdap) 03/16/2034   Hepatitis C Screening  Completed   Hepatitis B Vaccines 19-59 Average Risk  Aged Out   HPV VACCINES  Aged Out   Meningococcal B Vaccine  Aged Out   HIV Screening  Discontinued    Patient Care Team: Cray Monnin, Lauraine SAILOR, DO as PCP - General (Family Medicine)  Review of Systems  Respiratory: Negative.  Negative for cough, shortness of breath and wheezing.   Cardiovascular:  Negative for chest pain, palpitations and leg swelling.  Neurological:  Negative for weakness and headaches.        Objective    BP 114/76 (BP Location: Right Arm, Patient Position: Sitting, Cuff Size: Normal)   Pulse (!) 105   Ht 5' 4 (1.626 m)   Wt 150 lb (68 kg)   LMP  (LMP Unknown)   SpO2 97%   BMI 25.75 kg/m     Physical Exam Constitutional:      Appearance: Normal appearance.  HENT:     Head: Normocephalic and atraumatic.  Eyes:     General: No scleral icterus.    Extraocular Movements: Extraocular movements intact.     Conjunctiva/sclera: Conjunctivae normal.  Cardiovascular:     Rate and Rhythm: Normal rate and regular rhythm.     Pulses: Normal pulses.     Heart sounds: Normal heart sounds.  Pulmonary:     Effort: Pulmonary effort is normal. No respiratory distress.     Breath sounds: Normal breath sounds.  Musculoskeletal:     Right lower leg: No edema.     Left lower leg: No edema.  Skin:    General: Skin is warm and dry.  Neurological:     Mental Status: She is alert and oriented to person, place, and time. Mental status is at baseline.  Psychiatric:        Mood and Affect: Mood normal.        Behavior: Behavior normal.     The 10-year ASCVD risk score (Arnett DK, et al., 2019) is: 3.3%   Values used to calculate the score:     Age: 21 years      Clincally relevant sex: Female     Is Non-Hispanic African American: No     Diabetic: No     Tobacco smoker: No     Systolic Blood Pressure: 114 mmHg     Is BP treated: No     HDL Cholesterol: 73 mg/dL     Total Cholesterol: 218 mg/dL   Depression Screen    03/16/2024    9:15 AM 07/30/2022    3:41 PM 02/25/2022    3:53 PM 10/10/2021    3:46 PM  PHQ 2/9 Scores  PHQ - 2 Score 0 0 0 2  PHQ- 9 Score 0 0 0 4   No results found for any visits on 03/16/24.  Assessment & Plan     Generalized anxiety disorder -     Sertraline  HCl; Take 1 tablet (100 mg total) by mouth daily.  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  Dispense: 90 tablet; Refill: 3  Panic attacks  Paresthesia -     Vitamin B12  Chronic kidney disease, stage 2, mildly decreased GFR -     Microalbumin / creatinine urine ratio  Need for Tdap vaccination -     Tdap vaccine greater than or equal to 7yo IM  Vitamin D  deficiency -     VITAMIN D  25 Hydroxy (Vit-D Deficiency, Fractures)  Encounter for screening for cardiovascular disorders -     Lipid panel  Screening for endocrine, metabolic and immunity disorder -     Comprehensive metabolic panel with GFR  Encounter for screening mammogram for breast cancer -     3D Screening Mammogram, Left and Right; Future      Generalized anxiety disorder with panic attacks and depression, stable on sertraline  Generalized anxiety disorder and depression are well-managed on sertraline  100 mg. Occasional use of Xanax  for anxiety. Previous attempt to discontinue sertraline  was unsuccessful. - Refill sertraline  prescription through [facility].  Paresthesia Persistent numbness in the right foot noted. Consideration of vitamin B12 deficiency due to numbness and tingling. - Check vitamin B12  levels.  Chronic kidney disease stage 2 Chronic kidney disease stage 2 with mildly decreased kidney function. Advised against NSAIDs like ibuprofen. - Monitor kidney function and proteinuria. - Check urine for protein. - Continue to optimize risk factors    Return in about 3 months (around 06/16/2024) for CPE.     I discussed the assessment and treatment plan with the patient  The patient was provided an opportunity to ask questions and all were answered. The patient agreed with the plan and demonstrated an understanding of the instructions.   The patient was advised to call back or seek an in-person evaluation if the symptoms worsen or if the condition fails to improve as anticipated.    LAURAINE LOISE BUOY, DO  [facility] [phone] (phone) [phone] (fax)  [facility]

## 2024-04-02 LAB — COMPREHENSIVE METABOLIC PANEL WITH GFR
ALT: 21 IU/L (ref 0–32)
AST: 27 IU/L (ref 0–40)
Albumin: 5 g/dL — ABNORMAL HIGH (ref 3.9–4.9)
Alkaline Phosphatase: 75 IU/L (ref 44–121)
BUN/Creatinine Ratio: 22 (ref 12–28)
BUN: 22 mg/dL (ref 8–27)
Bilirubin Total: <0.2 mg/dL (ref 0.0–1.2)
CO2: 23 mmol/L (ref 20–29)
Calcium: 10.1 mg/dL (ref 8.7–10.3)
Chloride: 103 mmol/L (ref 96–106)
Creatinine, Ser: 1.01 mg/dL — ABNORMAL HIGH (ref 0.57–1.00)
Globulin, Total: 2.5 g/dL (ref 1.5–4.5)
Glucose: 80 mg/dL (ref 70–99)
Potassium: 4.3 mmol/L (ref 3.5–5.2)
Sodium: 143 mmol/L (ref 134–144)
Total Protein: 7.5 g/dL (ref 6.0–8.5)
eGFR: 63 mL/min/1.73 (ref >59)

## 2024-04-02 LAB — LIPID PANEL
Chol/HDL Ratio: 2.8 ratio (ref 0.0–4.4)
Cholesterol, Total: 236 mg/dL — ABNORMAL HIGH (ref 100–199)
HDL: 83 mg/dL (ref >39)
LDL Chol Calc (NIH): 133 mg/dL — ABNORMAL HIGH (ref 0–99)
Triglycerides: 115 mg/dL (ref 0–149)
VLDL Cholesterol Cal: 20 mg/dL (ref 5–40)

## 2024-04-02 LAB — VITAMIN B12: Vitamin B-12: 673 pg/mL (ref 232–1245)

## 2024-04-02 LAB — VITAMIN D 25 HYDROXY (VIT D DEFICIENCY, FRACTURES): Vit D, 25-Hydroxy: 61.6 ng/mL (ref 30.0–100.0)

## 2024-04-02 LAB — MICROALBUMIN / CREATININE URINE RATIO
Creatinine, Urine: 103.4 mg/dL
Microalb/Creat Ratio: 5 mg/g{creat} (ref 0–29)
Microalbumin, Urine: 4.9 ug/mL

## 2024-04-15 ENCOUNTER — Ambulatory Visit: Payer: Self-pay | Admitting: Family Medicine

## 2024-05-02 ENCOUNTER — Telehealth: Admitting: Family Medicine

## 2024-05-02 ENCOUNTER — Ambulatory Visit: Admission: RE | Admit: 2024-05-02 | Discharge: 2024-05-02 | Disposition: A | Discharge status: Home / Self Care

## 2024-05-02 VITALS — BP 120/75 | HR 88 | Temp 97.8°F | Resp 18

## 2024-05-02 DIAGNOSIS — R42 Dizziness and giddiness: Secondary | ICD-10-CM

## 2024-05-02 NOTE — ED Provider Notes (Signed)
 Melinda Palmer    CSN: [id number] Arrival date & time: 05/02/24  1748      History   Chief Complaint Chief Complaint  Patient presents with   Dizziness    No other symptoms - Entered by patient    HPI Melinda Palmer is a 63 y.o. female.  Patient presents with 1 week history of intermittent dizziness.  The episodes occur when she stands up quickly or moves her head up and down.  She briefly feels off balance.  No sensation of room spinning.  She reports remote history of vertigo with similar symptoms.  She has been treating her symptoms with OTC meclizine.  During the first episode of vertigo last week, she had associated nausea but this has not happened again.  She denies chest pain, palpitations, shortness of breath, focal weakness, numbness.  Patient had a telehealth visit today and was instructed to be seen in person.  The history is provided by the patient and medical records.    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   Body mass index 28.0-28.9, adult 05/22/2021   Physical exam, annual 12/03/2019   Encounter for long-term (current) use of medications 06/12/2019   Benign neoplasm of transverse colon    Polyp of sigmoid colon    Other fatigue 08/29/2018   Major depression, chronic 08/29/2018   Generalized anxiety disorder 08/29/2018   Vitamin D  deficiency 08/29/2018    Past Surgical History:  Procedure Laterality Date   child birth natural  1985   COLONOSCOPY WITH PROPOFOL  N/A 09/07/2018   Procedure: COLONOSCOPY WITH PROPOFOL ;  Surgeon: Jinny Carmine, MD;  Location: [facility] ENDOSCOPY;  Service: Endoscopy;  Laterality: N/A;    OB History   No obstetric history on file.      Home Medications    Prior to Admission medications   Medication Sig Start Date End Date Taking? Authorizing Provider  ASHWAGANDHA PO  Take by mouth.   Yes [provider]  FLAXSEED, LINSEED, PO Take by mouth.   Yes [provider]  ALPRAZolam  (XANAX ) 0.5 MG tablet Take 1 tablet (0.5 mg total) by mouth at bedtime as needed for anxiety. 07/30/22   Hanford Powell BRAVO, NP  sertraline  (ZOLOFT ) 100 MG tablet Take 1 tablet (100 mg total) by mouth daily. 03/16/24   Donzella Lauraine SAILOR, DO    Family History Family History  Problem Relation Age of Onset   Cancer Father    Heart attack Father    Anxiety disorder Father    Depression Father    Heart disease Father    Stroke Maternal Grandmother    Diabetes Maternal Grandmother    Obesity Maternal Grandmother    Diabetes Paternal Grandmother    Hypertension Neg Hx    Hyperlipidemia Neg Hx    Lymphoma Neg Hx    Migraines Neg Hx    Glaucoma Neg Hx     Social History Social History   Tobacco Use   Smoking status: Never   Smokeless tobacco: Never  Vaping Use   Vaping status: Never Used  Substance Use Topics   Alcohol use: Yes    Comment: ocassionally    Drug use: No     Allergies   Penicillin g and Penicillins   Review of Systems Review of Systems  Constitutional:  Negative for  chills and fever.  Respiratory:  Negative for cough and shortness of breath.   Cardiovascular:  Negative for chest pain and palpitations.  Gastrointestinal:  Positive for nausea. Negative for vomiting.  Neurological:  Positive for dizziness and light-headedness. Negative for syncope, facial asymmetry, speech difficulty, weakness, numbness and headaches.     Physical Exam Triage Vital Signs ED Triage Vitals  Encounter Vitals Group     BP 05/02/24 1809 120/75     Girls Systolic BP Percentile --      Girls Diastolic BP Percentile --      Boys Systolic BP Percentile --      Boys Diastolic BP Percentile --      Pulse Rate 05/02/24 1809 88     Resp 05/02/24 1809 18     Temp 05/02/24 1809 97.8 F (36.6 C)     Temp src --      SpO2 05/02/24 1809 98 %     Weight --       Height --      Head Circumference --      Peak Flow --      Pain Score 05/02/24 1812 0     Pain Loc --      Pain Education --      Exclude from Growth Chart --    Orthostatic VS for the past 24 hrs:  BP- Lying Pulse- Lying BP- Sitting Pulse- Sitting BP- Standing at 0 minutes Pulse- Standing at 0 minutes  05/02/24 1832 124/78 84 134/81 90 128/85 83    Updated Vital Signs BP 120/75   Pulse 88   Temp 97.8 F (36.6 C)   Resp 18   LMP  (LMP Unknown)   SpO2 98%   Visual Acuity Right Eye Distance:   Left Eye Distance:   Bilateral Distance:    Right Eye Near:   Left Eye Near:    Bilateral Near:     Physical Exam Constitutional:      General: She is not in acute distress. HENT:     Right Ear: Tympanic membrane normal.     Left Ear: Tympanic membrane normal.     Nose: Nose normal.     Mouth/Throat:     Mouth: Mucous membranes are moist.     Pharynx: Oropharynx is clear.  Cardiovascular:     Rate and Rhythm: Normal rate and regular rhythm.     Heart sounds: Normal heart sounds.  Pulmonary:     Effort: Pulmonary effort is normal. No respiratory distress.     Breath sounds: Normal breath sounds.  Skin:    General: Skin is warm and dry.  Neurological:     General: No focal deficit present.     Mental Status: She is alert and oriented to person, place, and time.     Cranial Nerves: No cranial nerve deficit.     Sensory: No sensory deficit.     Motor: No weakness.     Coordination: Romberg sign negative.     Gait: Gait normal.      UC Treatments / Results  Labs (all labs ordered are listed, but only abnormal results are displayed) Labs Reviewed - No data to display  EKG   Radiology No results found.  Procedures Procedures (including critical care time)  Medications Ordered in UC Medications - No data to display  Initial Impression / Assessment and Plan / UC Course  I have reviewed the triage vital signs and the nursing notes.  Pertinent labs &  imaging  results that were available during my care of the patient were reviewed by me and considered in my medical decision making (see chart for details).   Dizziness.  Afebrile and vital signs are stable.  Patient is not orthostatic.  EKG shows sinus rhythm with occasional PVC, rate 77, no ST elevation, no previous to compare.  She has had no chest pain or focal weakness.  No sensation of the room spinning but feels off balance briefly.  Discussed limitations of evaluation of her symptoms in an urgent care setting.  Instructed patient to follow-up with her PCP tomorrow.  ED precautions given.  Education provided on dizziness.  Patient has OTC meclizine at home.  She agrees to plan of care.  Final Clinical Impressions(s) / UC Diagnoses   Final diagnoses:  Dizziness     Discharge Instructions      Follow up with your primary care provider tomorrow.  Go to the emergency department if you have worsening symptoms.        ED Prescriptions   None    PDMP not reviewed this encounter.   Corlis Burnard DEL, NP 05/02/24 [phone]

## 2024-05-02 NOTE — Progress Notes (Signed)
 [place]   Going in person given Dizziness.  Patient acknowledged agreement and understanding of the plan.

## 2024-05-02 NOTE — ED Triage Notes (Signed)
 Patient to Urgent Care with complaints of intermittent dizziness/ nausea.   Symptoms x1 week. Woke up w/ symptoms. Reports dizziness when standing fast/ looking up and around.   Hx of vertigo and states this feels the same.   Attempted meclizine otc with some relief.

## 2024-05-02 NOTE — Discharge Instructions (Addendum)
Follow up with your primary care provider tomorrow.  Go to the emergency department if you have worsening symptoms.

## 2024-05-03 ENCOUNTER — Ambulatory Visit: Admitting: Family Medicine

## 2024-05-03 ENCOUNTER — Encounter: Payer: Self-pay | Admitting: Family Medicine

## 2024-05-03 VITALS — BP 107/72 | HR 79 | Temp 98.2°F | Ht 65.0 in | Wt 154.7 lb

## 2024-05-03 DIAGNOSIS — F411 Generalized anxiety disorder: Secondary | ICD-10-CM

## 2024-05-03 DIAGNOSIS — R42 Dizziness and giddiness: Secondary | ICD-10-CM

## 2024-05-03 MED ORDER — MECLIZINE HCL 25 MG PO TABS: 25.0000 mg | ORAL_TABLET | Freq: Three times a day (TID) | ORAL | 0 refills | Status: DC | PRN

## 2024-05-03 MED ORDER — ALPRAZOLAM 0.5 MG PO TABS: 0.5000 mg | ORAL_TABLET | Freq: Every evening | ORAL | 1 refills | Status: AC | PRN

## 2024-05-03 NOTE — Progress Notes (Signed)
 Established patient visit   Patient: Melinda Palmer   DOB: 1961-06-18   63 y.o. Female  MRN: [id number] Visit Date: 05/03/2024  Today's healthcare provider: LAURAINE LOISE BUOY, DO   Chief Complaint  Patient presents with   Acute Visit    Vertigo symptoms dizziness when moves head real fast when turning her head or looking up.  First thing when she gets up in the morning after laying down, she gets dizziness.   Subjective    HPI Melinda Palmer is a 63 year old female who presents with recurrent episodes of vertigo.  She has been experiencing recurrent episodes of vertigo, described as dizziness, for the past few weeks. The episodes [place] the beach and occur primarily in the morning and after looking up or standing up quickly. Initially, the dizziness was severe enough to cause nausea, but significant nausea has not been present recently.  She has a history of similar vertigo episodes in the past, for which she used meclizine with some relief. Currently, she is taking over-the-counter meclizine 25 mg once daily, although the packaging suggests it can be taken up to twice daily. Despite this treatment, the dizziness persists.  She visited urgent care the previous night, where an EKG and orthostatic blood pressure measurements were performed, all of which were normal. However, she experienced dizziness upon standing during the evaluation.   She has a prescription for alprazolam  but has not used it recently. She also inquired about the use of Dramamine for her symptoms, although she has not tried it yet.      Medications: Outpatient Medications Prior to Visit  Medication Sig   ASHWAGANDHA PO Take by mouth.   FLAXSEED, LINSEED, PO Take by mouth.   sertraline  (ZOLOFT ) 100 MG tablet Take 1 tablet (100 mg total) by mouth daily.   [DISCONTINUED] ALPRAZolam  (XANAX ) 0.5 MG tablet Take 1 tablet (0.5 mg total) by mouth at bedtime as needed for anxiety.   No facility-administered  medications prior to visit.   Review of Systems  Respiratory: Negative.  Negative for shortness of breath.   Cardiovascular:  Negative for chest pain and leg swelling.  Neurological:  Positive for dizziness. Negative for weakness and headaches.        Objective    BP 107/72 (BP Location: Left Arm, Patient Position: Sitting, Cuff Size: Normal)   Pulse 79   Temp 98.2 F (36.8 C) (Oral)   Ht 5' 5 (1.651 m)   Wt 154 lb 11.2 oz (70.2 kg)   LMP  (LMP Unknown)   SpO2 99%   BMI 25.74 kg/m     Physical Exam Vitals and nursing note reviewed.  Constitutional:      General: She is not in acute distress.    Appearance: Normal appearance.  HENT:     Head: Normocephalic and atraumatic.  Eyes:     General: No scleral icterus.    Conjunctiva/sclera: Conjunctivae normal.  Cardiovascular:     Rate and Rhythm: Normal rate.  Pulmonary:     Effort: Pulmonary effort is normal.  Neurological:     Mental Status: She is alert and oriented to person, place, and time. Mental status is at baseline.     Comments: Dix-Hallpike maneuver negative bilaterally.  Psychiatric:        Mood and Affect: Mood normal.        Behavior: Behavior normal.      No results found for any visits on 05/03/24.  Assessment &  Plan    Vertigo -     Meclizine HCl; Take 1 tablet (25 mg total) by mouth 3 (three) times daily as needed for dizziness.  Dispense: 30 tablet; Refill: 0 -     ALPRAZolam ; Take 1 tablet (0.5 mg total) by mouth at bedtime as needed for anxiety.  Dispense: 30 tablet; Refill: 1  Generalized anxiety disorder -     ALPRAZolam ; Take 1 tablet (0.5 mg total) by mouth at bedtime as needed for anxiety.  Dispense: 30 tablet; Refill: 1     Vertigo Intermittent vertigo. Abnormal cardiac rhythm and orthostatic hypotension ruled out during urgent care visit last night.  - Increase meclizine up to three times daily as needed. - Consider alprazolam  for short-term use, limit to two to three days (using  current prescription, refilled today for anxiety). - Provided instructions for right-sided vertigo maneuvers, including both Epley and half somersault maneuvers, as this side felt mildly worse to patient's during Dix-Hallpike evaluation. - Offered Zofran for nausea if needed; patient declined for the time being but will request via MyChart if symptoms return. - Recommend meclizine over Benadryl or Dramamine.    Return if symptoms worsen or fail to improve.      I discussed the assessment and treatment plan with the patient  The patient was provided an opportunity to ask questions and all were answered. The patient agreed with the plan and demonstrated an understanding of the instructions.   The patient was advised to call back or seek an in-person evaluation if the symptoms worsen or if the condition fails to improve as anticipated.    LAURAINE LOISE BUOY, DO  [facility] [phone] (phone) [phone] (fax)  [facility]

## 2024-05-10 LAB — HM MAMMOGRAPHY

## 2024-06-20 ENCOUNTER — Ambulatory Visit: Admitting: Family Medicine

## 2024-06-29 ENCOUNTER — Encounter: Payer: Self-pay | Admitting: Family Medicine

## 2024-07-08 ENCOUNTER — Ambulatory Visit: Admitting: Family Medicine

## 2024-07-08 ENCOUNTER — Encounter: Payer: Self-pay | Admitting: Family Medicine

## 2024-07-08 VITALS — BP 117/82 | HR 63 | Temp 98.3°F | Ht 65.0 in | Wt 160.5 lb

## 2024-07-08 DIAGNOSIS — R350 Frequency of micturition: Secondary | ICD-10-CM | POA: Diagnosis not present

## 2024-07-08 DIAGNOSIS — Z0000 Encounter for general adult medical examination without abnormal findings: Secondary | ICD-10-CM

## 2024-07-08 NOTE — Progress Notes (Signed)
 "    Complete physical exam   Patient: Melinda Palmer   DOB: April 22, 1961   63 y.o. Female  MRN: [id number] Visit Date: 07/08/2024  Today's healthcare provider: LAURAINE LOISE BUOY, DO   Chief Complaint  Patient presents with   Annual Exam    Diet- General Exercise- No Overall feeling- Good Sleep- Like a rock Concerns- None  Vaccines declined   Subjective    Melinda Palmer is a 63 y.o. female who presents today for a complete physical exam.   HPI HPI     Annual Exam    Additional comments: Diet- General Exercise- No Overall feeling- Good Sleep- Like a rock Concerns- None  Vaccines declined      Last edited by Terrel Powell CROME, CMA on 07/08/2024  4:12 PM.       Melinda Palmer is a 63 year old female who presents for an annual physical exam.  She has no current health concerns and her mood is stable. She consumes alcohol occasionally, about two to four times a month, with an intake of approximately two drinks per occasion.  She recalls a previous episode of inner ear issues, described as 'terrible', for which she was prescribed Xanax . However, she did not take the medication as the symptoms resolved spontaneously.  She notes increased frequency of urination. She has one child delivered vaginally.  She is less physically active than before but walks her dog several times a day. She acknowledges feeling better when she had a regular walking routine.  She visits an eye doctor annually and is scheduled to return in May. She also sees a dentist regularly.     Past Medical History:  Diagnosis Date   Anxiety    Depression    Migraines    Past Surgical History:  Procedure Laterality Date   child birth natural  [birth year]   COLONOSCOPY WITH PROPOFOL  N/A 09/07/2018   Procedure: COLONOSCOPY WITH PROPOFOL ;  Surgeon: Jinny Carmine, MD;  Location: [facility] ENDOSCOPY;  Service: Endoscopy;  Laterality: N/A;   Social History   Socioeconomic History   Marital status: Single     Spouse name: Not on file   Number of children: Not on file   Years of education: Not on file   Highest education level: 12th grade  Occupational History   Not on file  Tobacco Use   Smoking status: Never   Smokeless tobacco: Never  Vaping Use   Vaping status: Never Used  Substance and Sexual Activity   Alcohol use: Yes    Comment: ocassionally    Drug use: No   Sexual activity: Yes    Partners: Male  Other Topics Concern   Not on file  Social History Narrative   Not on file   Social Drivers of Health   Tobacco Use: Low Risk (07/08/2024)   Patient History    Smoking Tobacco Use: Never    Smokeless Tobacco Use: Never    Passive Exposure: Not on file  Financial Resource Strain: Low Risk (05/02/2024)   Overall Financial Resource Strain (CARDIA)    Difficulty of Paying Living Expenses: Not hard at all  Food Insecurity: No Food Insecurity (05/02/2024)   Epic    Worried About Programme Researcher, Broadcasting/film/video in the Last Year: Never true    Ran Out of Food in the Last Year: Never true  Transportation Needs: No Transportation Needs (05/02/2024)   Epic    Lack of Transportation (Medical): No    Lack of Transportation (Non-Medical):  No  Physical Activity: Insufficiently Active (05/02/2024)   Exercise Vital Sign    Days of Exercise per Week: 1 day    Minutes of Exercise per Session: 20 min  Stress: No Stress Concern Present (05/02/2024)   [employer] of Occupational Health - Occupational Stress Questionnaire    Feeling of Stress: Not at all  Social Connections: Moderately Isolated (05/02/2024)   Social Connection and Isolation Panel    Frequency of Communication with Friends and Family: More than three times a week    Frequency of Social Gatherings with Friends and Family: Once a week    Attends Religious Services: 1 to 4 times per year    Active Member of [employer] or Organizations: No    Attends Banker Meetings: Not on file    Marital Status: Divorced  Intimate Partner  Violence: Not At Risk (07/08/2024)   Epic    Fear of Current or Ex-Partner: No    Emotionally Abused: No    Physically Abused: No    Sexually Abused: No  Depression (PHQ2-9): Low Risk (07/08/2024)   Depression (PHQ2-9)    PHQ-2 Score: 0  Alcohol Screen: Low Risk (07/08/2024)   Alcohol Screen    Last Alcohol Screening Score (AUDIT): 2  Housing: Low Risk (05/02/2024)   Epic    Unable to Pay for Housing in the Last Year: No    Number of Times Moved in the Last Year: 0    Homeless in the Last Year: No  Utilities: Not At Risk (07/08/2024)   Epic    Threatened with loss of utilities: No  Health Literacy: Adequate Health Literacy (07/08/2024)   B1300 Health Literacy    Frequency of need for help with medical instructions: Never   Family Status  Relation Name Status   Mother  Deceased   Father Monifa Blanchette Deceased   [employer] (Not Specified)   PGM Grandmother (Not Specified)   Neg Hx  (Not Specified)  No partnership data on file   Family History  Problem Relation Age of Onset   Cancer Father    Heart attack Father    Anxiety disorder Father    Depression Father    Heart disease Father    Stroke Maternal Grandmother    Diabetes Maternal Grandmother    Obesity Maternal Grandmother    Diabetes Paternal Grandmother    Hypertension Neg Hx    Hyperlipidemia Neg Hx    Lymphoma Neg Hx    Migraines Neg Hx    Glaucoma Neg Hx    Allergies[1]  Patient Care Team: Arshad Oberholzer, Lauraine SAILOR, DO as PCP - General (Family Medicine)   Medications: Show/hide medication list[2]  Review of Systems  Constitutional:  Negative for chills, fatigue and fever.  HENT:  Negative for congestion, ear pain, rhinorrhea, sneezing and sore throat.   Eyes: Negative.  Negative for pain and redness.  Respiratory:  Negative for cough, shortness of breath and wheezing.   Cardiovascular:  Negative for chest pain and leg swelling.  Gastrointestinal:  Negative for abdominal pain, blood in stool,  constipation, diarrhea and nausea.  Endocrine: Negative for polydipsia and polyphagia.  Genitourinary: Negative.  Negative for dysuria, flank pain, hematuria, pelvic pain, vaginal bleeding and vaginal discharge.  Musculoskeletal:  Negative for arthralgias, back pain, gait problem and joint swelling.  Skin:  Negative for rash.  Neurological: Negative.  Negative for dizziness, tremors, seizures, weakness, light-headedness, numbness and headaches.  Hematological:  Negative for adenopathy.  Psychiatric/Behavioral: Negative.  Negative  for behavioral problems, confusion and dysphoric mood. The patient is not nervous/anxious and is not hyperactive.       Objective    BP 117/82 (BP Location: Right Arm, Patient Position: Sitting, Cuff Size: Normal)   Pulse 63   Temp 98.3 F (36.8 C) (Oral)   Ht 5' 5 (1.651 m)   Wt 160 lb 8 oz (72.8 kg)   LMP  (LMP Unknown)   SpO2 99%   BMI 26.71 kg/m    Physical Exam Vitals and nursing note reviewed.  Constitutional:      General: She is awake.     Appearance: Normal appearance.  HENT:     Head: Normocephalic and atraumatic.     Right Ear: Tympanic membrane, ear canal and external ear normal.     Left Ear: Tympanic membrane, ear canal and external ear normal.     Nose: Nose normal.     Mouth/Throat:     Mouth: Mucous membranes are moist.     Pharynx: Oropharynx is clear. No oropharyngeal exudate or posterior oropharyngeal erythema.  Eyes:     General: No scleral icterus.    Extraocular Movements: Extraocular movements intact.     Conjunctiva/sclera: Conjunctivae normal.     Pupils: Pupils are equal, round, and reactive to light.  Neck:     Thyroid : No thyromegaly or thyroid  tenderness.  Cardiovascular:     Rate and Rhythm: Normal rate and regular rhythm.     Pulses: Normal pulses.     Heart sounds: Normal heart sounds.  Pulmonary:     Effort: Pulmonary effort is normal. No tachypnea, bradypnea or respiratory distress.     Breath sounds:  Normal breath sounds. No stridor. No wheezing, rhonchi or rales.  Abdominal:     General: Bowel sounds are normal. There is no distension.     Palpations: Abdomen is soft. There is no mass.     Tenderness: There is no abdominal tenderness. There is no guarding.     Hernia: No hernia is present.  Musculoskeletal:     Cervical back: Normal range of motion and neck supple.     Right lower leg: No edema.     Left lower leg: No edema.  Lymphadenopathy:     Cervical: No cervical adenopathy.  Skin:    General: Skin is warm and dry.  Neurological:     Mental Status: She is alert and oriented to person, place, and time. Mental status is at baseline.  Psychiatric:        Mood and Affect: Mood normal.        Behavior: Behavior normal.       Last depression screening scores    07/08/2024    4:20 PM 05/03/2024    1:58 PM 03/16/2024    9:15 AM  PHQ 2/9 Scores  PHQ - 2 Score 0 0 0  PHQ- 9 Score 0 0  0      Data saved with a previous flowsheet row definition   Last fall risk screening    07/08/2024    4:20 PM  Fall Risk   Falls in the past year? 0  Number falls in past yr: 0  Injury with Fall? 0  Risk for fall due to : No Fall Risks   Last Audit-C alcohol use screening    07/08/2024    4:46 PM  Alcohol Use Disorder Test (AUDIT)  1. How often do you have a drink containing alcohol? 2  2. How many drinks containing  alcohol do you have on a typical day when you are drinking? 0  3. How often do you have six or more drinks on one occasion? 0  AUDIT-C Score 2   A score of 3 or more in women, and 4 or more in men indicates increased risk for alcohol abuse, EXCEPT if all of the points are from question 1   No results found for any visits on 07/08/24.  Assessment & Plan    Routine Health Maintenance and Physical Exam  Exercise Activities and Dietary recommendations  Goals   None     Immunization History  Administered Date(s) Administered   Janssen (J&J) SARS-COV-2  Vaccination 10/26/2019   Tdap 03/16/2024    Health Maintenance  Topic Date Due   Zoster Vaccines- Shingrix (1 of 2) 10/06/2024 (Originally 03/07/1980)   Influenza Vaccine  10/18/2024 (Originally 02/19/2024)   COVID-19 Vaccine (2 - Janssen risk series) 03/13/2025 (Originally 11/23/2019)   Pneumococcal Vaccine: 50+ Years (1 of 1 - PCV) 03/16/2025 (Originally 03/08/2011)   Cervical Cancer Screening (HPV/Pap Cotest)  11/17/2024   Mammogram  05/10/2026   Colonoscopy  09/07/2028   DTaP/Tdap/Td (2 - Td or Tdap) 03/16/2034   Hepatitis C Screening  Completed   Hepatitis B Vaccines 19-59 Average Risk  Aged Out   HPV VACCINES  Aged Out   Meningococcal B Vaccine  Aged Out   HIV Screening  Discontinued    Discussed health benefits of physical activity, and encouraged her to engage in regular exercise appropriate for her age and condition.   Annual physical exam  Urinary frequency     Annual physical exam Physical exam overall unremarkable except as noted above. Routine lab work up-to-date.  Discussed vaccinations, including influenza, shingles, pneumonia and COVID; patient declined all vaccines today.  Urinary frequency Increased frequency likely due to age and previous childbirth. No pain, burning, or hematuria. - Recommended Kegel exercises to strengthen pelvic floor muscles.     Return in about 1 year (around 07/08/2025) for CPE w/next provider .     I discussed the assessment and treatment plan with the patient  The patient was provided an opportunity to ask questions and all were answered. The patient agreed with the plan and demonstrated an understanding of the instructions.   The patient was advised to call back or seek an in-person evaluation if the symptoms worsen or if the condition fails to improve as anticipated.    LAURAINE LOISE BUOY, DO  [facility] [phone] (phone) [phone] (fax)  [facility]     [1]   Allergies Allergen Reactions   Penicillin G Rash and Swelling   Penicillins   [2]  Outpatient Medications Prior to Visit  Medication Sig   ALPRAZolam  (XANAX ) 0.5 MG tablet Take 1 tablet (0.5 mg total) by mouth at bedtime as needed for anxiety.   ASHWAGANDHA PO Take by mouth.   FLAXSEED, LINSEED, PO Take by mouth.   sertraline  (ZOLOFT ) 100 MG tablet Take 1 tablet (100 mg total) by mouth daily.   [DISCONTINUED] meclizine  (ANTIVERT ) 25 MG tablet Take 1 tablet (25 mg total) by mouth 3 (three) times daily as needed for dizziness.   No facility-administered medications prior to visit.   "
